# Patient Record
Sex: Male | Born: 1945
Health system: Southern US, Community
[De-identification: ages and names within clinical notes are randomized; demographics above are authoritative.]

## PROBLEM LIST (undated history)

## (undated) DIAGNOSIS — F1010 Alcohol abuse, uncomplicated: Secondary | ICD-10-CM

## (undated) DIAGNOSIS — Y249XXA Unspecified firearm discharge, undetermined intent, initial encounter: Secondary | ICD-10-CM

## (undated) DIAGNOSIS — I10 Essential (primary) hypertension: Secondary | ICD-10-CM

## (undated) DIAGNOSIS — H409 Unspecified glaucoma: Secondary | ICD-10-CM

## (undated) DIAGNOSIS — M5090 Cervical disc disorder, unspecified, unspecified cervical region: Secondary | ICD-10-CM

## (undated) DIAGNOSIS — E785 Hyperlipidemia, unspecified: Secondary | ICD-10-CM

## (undated) DIAGNOSIS — M1990 Unspecified osteoarthritis, unspecified site: Secondary | ICD-10-CM

## (undated) DIAGNOSIS — K219 Gastro-esophageal reflux disease without esophagitis: Secondary | ICD-10-CM

## (undated) DIAGNOSIS — C61 Malignant neoplasm of prostate: Secondary | ICD-10-CM

## (undated) DIAGNOSIS — N529 Male erectile dysfunction, unspecified: Secondary | ICD-10-CM

## (undated) DIAGNOSIS — W3400XA Accidental discharge from unspecified firearms or gun, initial encounter: Secondary | ICD-10-CM

## (undated) HISTORY — PX: CATARACT EXTRACTION: SUR2

## (undated) HISTORY — DX: Gastro-esophageal reflux disease without esophagitis: K21.9

## (undated) HISTORY — PX: KNEE SURGERY: SHX244

## (undated) HISTORY — DX: Alcohol abuse, uncomplicated: F10.10

## (undated) HISTORY — DX: Hyperlipidemia, unspecified: E78.5

## (undated) HISTORY — DX: Unspecified glaucoma: H40.9

## (undated) HISTORY — DX: Malignant neoplasm of prostate: C61

## (undated) HISTORY — DX: Accidental discharge from unspecified firearms or gun, initial encounter: W34.00XA

## (undated) HISTORY — DX: Essential (primary) hypertension: I10

## (undated) HISTORY — DX: Other disorders of calcium metabolism: E83.59

## (undated) HISTORY — DX: Cervical disc disorder, unspecified, unspecified cervical region: M50.90

## (undated) HISTORY — DX: Male erectile dysfunction, unspecified: N52.9

## (undated) HISTORY — PX: COLONOSCOPY: SHX174

## (undated) HISTORY — DX: Unspecified firearm discharge, undetermined intent, initial encounter: Y24.9XXA

---

## 1999-03-06 ENCOUNTER — Emergency Department (HOSPITAL_COMMUNITY): Admission: EM | Admit: 1999-03-06 | Discharge: 1999-03-06 | Payer: Self-pay | Admitting: Emergency Medicine

## 1999-05-07 ENCOUNTER — Emergency Department (HOSPITAL_COMMUNITY): Admission: EM | Admit: 1999-05-07 | Discharge: 1999-05-07 | Payer: Self-pay

## 2001-10-06 ENCOUNTER — Emergency Department (HOSPITAL_COMMUNITY): Admission: EM | Admit: 2001-10-06 | Discharge: 2001-10-06 | Payer: Self-pay | Admitting: Emergency Medicine

## 2001-10-06 ENCOUNTER — Encounter: Payer: Self-pay | Admitting: Emergency Medicine

## 2003-04-30 ENCOUNTER — Encounter: Payer: Self-pay | Admitting: Emergency Medicine

## 2003-04-30 ENCOUNTER — Emergency Department (HOSPITAL_COMMUNITY): Admission: EM | Admit: 2003-04-30 | Discharge: 2003-04-30 | Payer: Self-pay | Admitting: *Deleted

## 2004-03-18 ENCOUNTER — Emergency Department (HOSPITAL_COMMUNITY): Admission: AD | Admit: 2004-03-18 | Discharge: 2004-03-18 | Payer: Self-pay | Admitting: Family Medicine

## 2004-06-21 ENCOUNTER — Emergency Department (HOSPITAL_COMMUNITY): Admission: EM | Admit: 2004-06-21 | Discharge: 2004-06-21 | Payer: Self-pay | Admitting: Family Medicine

## 2004-11-05 ENCOUNTER — Emergency Department (HOSPITAL_COMMUNITY): Admission: EM | Admit: 2004-11-05 | Discharge: 2004-11-05 | Payer: Self-pay | Admitting: Emergency Medicine

## 2005-05-09 ENCOUNTER — Emergency Department (HOSPITAL_COMMUNITY): Admission: EM | Admit: 2005-05-09 | Discharge: 2005-05-09 | Payer: Self-pay | Admitting: Emergency Medicine

## 2005-05-12 ENCOUNTER — Emergency Department (HOSPITAL_COMMUNITY): Admission: EM | Admit: 2005-05-12 | Discharge: 2005-05-12 | Payer: Self-pay | Admitting: Family Medicine

## 2006-03-19 ENCOUNTER — Ambulatory Visit (HOSPITAL_COMMUNITY): Admission: RE | Admit: 2006-03-19 | Discharge: 2006-03-19 | Payer: Self-pay | Admitting: Family Medicine

## 2006-03-19 ENCOUNTER — Emergency Department (HOSPITAL_COMMUNITY): Admission: EM | Admit: 2006-03-19 | Discharge: 2006-03-19 | Payer: Self-pay | Admitting: Family Medicine

## 2007-02-28 ENCOUNTER — Emergency Department (HOSPITAL_COMMUNITY): Admission: EM | Admit: 2007-02-28 | Discharge: 2007-02-28 | Payer: Self-pay | Admitting: Emergency Medicine

## 2010-01-29 ENCOUNTER — Ambulatory Visit: Payer: Self-pay | Admitting: Internal Medicine

## 2010-01-29 DIAGNOSIS — E785 Hyperlipidemia, unspecified: Secondary | ICD-10-CM

## 2010-01-29 DIAGNOSIS — M549 Dorsalgia, unspecified: Secondary | ICD-10-CM | POA: Insufficient documentation

## 2010-01-29 DIAGNOSIS — R5381 Other malaise: Secondary | ICD-10-CM

## 2010-01-29 DIAGNOSIS — R21 Rash and other nonspecific skin eruption: Secondary | ICD-10-CM

## 2010-01-29 DIAGNOSIS — I10 Essential (primary) hypertension: Secondary | ICD-10-CM

## 2010-01-29 DIAGNOSIS — G43009 Migraine without aura, not intractable, without status migrainosus: Secondary | ICD-10-CM | POA: Insufficient documentation

## 2010-01-29 DIAGNOSIS — R5383 Other fatigue: Secondary | ICD-10-CM

## 2010-01-29 DIAGNOSIS — M5090 Cervical disc disorder, unspecified, unspecified cervical region: Secondary | ICD-10-CM

## 2010-01-29 HISTORY — DX: Other disorders of calcium metabolism: E83.59

## 2010-01-29 HISTORY — DX: Hyperlipidemia, unspecified: E78.5

## 2010-01-29 HISTORY — DX: Cervical disc disorder, unspecified, unspecified cervical region: M50.90

## 2010-01-30 ENCOUNTER — Encounter (INDEPENDENT_AMBULATORY_CARE_PROVIDER_SITE_OTHER): Payer: Self-pay | Admitting: *Deleted

## 2010-02-01 ENCOUNTER — Encounter (INDEPENDENT_AMBULATORY_CARE_PROVIDER_SITE_OTHER): Payer: Self-pay | Admitting: *Deleted

## 2010-02-04 ENCOUNTER — Ambulatory Visit: Payer: Self-pay | Admitting: Gastroenterology

## 2010-02-15 ENCOUNTER — Ambulatory Visit: Payer: Self-pay | Admitting: Gastroenterology

## 2010-02-20 ENCOUNTER — Encounter: Payer: Self-pay | Admitting: Gastroenterology

## 2010-10-26 ENCOUNTER — Emergency Department (HOSPITAL_COMMUNITY): Admission: EM | Admit: 2010-10-26 | Discharge: 2010-10-26 | Payer: Self-pay | Admitting: Family Medicine

## 2010-10-31 ENCOUNTER — Ambulatory Visit: Payer: Self-pay | Admitting: Internal Medicine

## 2010-10-31 DIAGNOSIS — R0789 Other chest pain: Secondary | ICD-10-CM

## 2010-10-31 DIAGNOSIS — K219 Gastro-esophageal reflux disease without esophagitis: Secondary | ICD-10-CM

## 2011-01-26 LAB — CONVERTED CEMR LAB
ALT: 17 units/L (ref 0–53)
BUN: 12 mg/dL (ref 6–23)
Basophils Relative: 0.1 % (ref 0.0–3.0)
CO2: 30 meq/L (ref 19–32)
Chloride: 106 meq/L (ref 96–112)
Cholesterol: 195 mg/dL (ref 0–200)
Eosinophils Relative: 2.6 % (ref 0.0–5.0)
HCT: 42.1 % (ref 39.0–52.0)
LDL Cholesterol: 117 mg/dL — ABNORMAL HIGH (ref 0–99)
Lymphs Abs: 2 10*3/uL (ref 0.7–4.0)
MCV: 97.7 fL (ref 78.0–100.0)
Monocytes Absolute: 0.4 10*3/uL (ref 0.1–1.0)
Platelets: 353 10*3/uL (ref 150.0–400.0)
Potassium: 3.7 meq/L (ref 3.5–5.1)
Sed Rate: 22 mm/hr (ref 0–22)
Specific Gravity, Urine: 1.015 (ref 1.000–1.030)
TSH: 1.52 microintl units/mL (ref 0.35–5.50)
Total Bilirubin: 0.4 mg/dL (ref 0.3–1.2)
Total Protein, Urine: NEGATIVE mg/dL
Total Protein: 7.6 g/dL (ref 6.0–8.3)
Triglycerides: 130 mg/dL (ref 0.0–149.0)
Urine Glucose: NEGATIVE mg/dL
VLDL: 26 mg/dL (ref 0.0–40.0)
WBC: 7 10*3/uL (ref 4.5–10.5)

## 2011-01-30 NOTE — Letter (Signed)
Summary: [facility] Instructions  [facility]  [address] [place], [place] [address]   Phone: [phone]  Fax: [phone]       Brian Allen    [birth year]    MRN: [id number]        Procedure Day /Date: 02/15/10  Friday     Arrival Time: 7:30am     Procedure Time: 8:00am     Location of Procedure:                    _ x_  [facility] (4th Floor)   PREPARATION FOR COLONOSCOPY WITH MOVIPREP   Starting 5 days prior to your procedure _ 2/13/11_ do not eat nuts, seeds, popcorn, corn, beans, peas,  salads, or any raw vegetables.  Do not take any fiber supplements (e.g. Metamucil, Citrucel, and Benefiber).  THE DAY BEFORE YOUR PROCEDURE         DATE:  02/14/10  DAY: Thursday  1.  Drink clear liquids the entire day-NO SOLID FOOD  2.  Do not drink anything colored red or purple.  Avoid juices with pulp.  No orange juice.  3.  Drink at least 64 oz. (8 glasses) of fluid/clear liquids during the day to prevent dehydration and help the prep work efficiently.  CLEAR LIQUIDS INCLUDE: Water Jello Ice Popsicles Tea (sugar ok, no milk/cream) Powdered fruit flavored drinks Coffee (sugar ok, no milk/cream) Gatorade Juice: apple, white grape, white cranberry  Lemonade Clear bullion, consomm, broth Carbonated beverages (any kind) Strained chicken noodle soup Hard Candy                             4.  In the morning, mix first dose of MoviPrep solution:    Empty 1 Pouch A and 1 Pouch B into the disposable container    Add lukewarm drinking water to the top line of the container. Mix to dissolve    Refrigerate (mixed solution should be used within 24 hrs)  5.  Begin drinking the prep at 5:00 p.m. The MoviPrep container is divided by 4 marks.   Every 15 minutes drink the solution down to the next mark (approximately 8 oz) until the full liter is complete.   6.  Follow completed prep with 16 oz of clear liquid of your choice (Nothing red or purple).   Continue to drink clear liquids until bedtime.  7.  Before going to bed, mix second dose of MoviPrep solution:    Empty 1 Pouch A and 1 Pouch B into the disposable container    Add lukewarm drinking water to the top line of the container. Mix to dissolve    Refrigerate  THE DAY OF YOUR PROCEDURE      DATE: 02/15/10 DAY:  Friday  Beginning at  3:00a.m. (5 hours before procedure):         1. Every 15 minutes, drink the solution down to the next mark (approx 8 oz) until the full liter is complete.  2. Follow completed prep with 16 oz. of clear liquid of your choice.    3. You may drink clear liquids until  6:00am (2 HOURS BEFORE PROCEDURE).   MEDICATION INSTRUCTIONS  Unless otherwise instructed, you should take regular prescription medications with a small sip of water   as early as possible the morning of your procedure.     Additional medication instructions: _         OTHER  INSTRUCTIONS  You will need a responsible adult at least 67 years of age to accompany you and drive you home.   This person must remain in the waiting room during your procedure.  Wear loose fitting clothing that is easily removed.  Leave jewelry and other valuables at home.  However, you may wish to bring a book to read or  an iPod/MP3 player to listen to music as you wait for your procedure to start.  Remove all body piercing jewelry and leave at home.  Total time from sign-in until discharge is approximately 2-3 hours.  You should go home directly after your procedure and rest.  You can resume normal activities the  day after your procedure.  The day of your procedure you should not:   Drive   Make legal decisions   Operate machinery   Drink alcohol   Return to work  You will receive specific instructions about eating, activities and medications before you leave.    The above instructions have been reviewed and explained to me by   Sherren Kerns RN  February 04, 2010 10:59  AM    I fully understand and can verbalize these instructions _____________________________ Date _________
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
## 2011-01-30 NOTE — Letter (Signed)
Summary: Patient Notice- Polyp Results  [facility]  [address] [place], [place] [address]   Phone: [phone]  Fax: [phone]        February 20, 2010 MRN: [id number]    Brian Allen [address] [place], [place]  [address]    Dear [name],  I am pleased to inform you that the colon polyp(s) removed during your recent colonoscopy was (were) found to be benign (no cancer detected) upon pathologic examination.  I recommend you have a repeat colonoscopy examination in 5_ years to look for recurrent polyps, as having colon polyps increases your risk for having recurrent polyps or even colon cancer in the future.  Should you develop new or worsening symptoms of abdominal pain, bowel habit changes or bleeding from the rectum or bowels, please schedule an evaluation with either your primary care physician or with me.  Additional information/recommendations:  __ No further action with gastroenterology is needed at this time. Please      follow-up with your primary care physician for your other healthcare      needs.  __ Please call [phone] to schedule a return visit to review your      situation.  __ Please keep your follow-up visit as already scheduled.  _x_ Continue treatment plan as outlined the day of your exam.  Please call us if you are having persistent problems or have questions about your condition that have not been fully answered at this time.  Sincerely,  Louis Meckel MD  This letter has been electronically signed by your physician.  Appended Document: Patient Notice- Polyp Results  Letter mailed 2.24.11

## 2011-01-30 NOTE — Procedures (Signed)
Summary: Colonoscopy  Patient: Brian Allen Note: All result statuses are Final unless otherwise noted.  Tests: (1) Colonoscopy (COL)   COL Colonoscopy           DONE     [facility]     [address]     [place], [place]  [address]           COLONOSCOPY PROCEDURE REPORT           PATIENT:  Brian Allen, Brian Allen  MR#:  [id number]     BIRTHDATE:  27-Dec-1946, 63 yrs. old  GENDER:  male           ENDOSCOPIST:  Barbette Hair. Arlyce Dice, MD     Referred by:  Oliver Barre, M.D.           PROCEDURE DATE:  02/15/2010     PROCEDURE:  Colonoscopy with snare polypectomy, Colon with cold     biopsy polypectomy     ASA CLASS:  Class II     INDICATIONS:  Routine Risk Screening           MEDICATIONS:   Fentanyl 100 mcg IV, Versed 10 mg IV, Benadryl 25     mg IV           DESCRIPTION OF PROCEDURE:   After the risks benefits and     alternatives of the procedure were thoroughly explained, informed     consent was obtained.  Digital rectal exam was performed and     revealed no abnormalities.   The LB [id number] endoscope     was introduced through the anus and advanced to the cecum, which     was identified by both the appendix and ileocecal valve, without     limitations.  The quality of the prep was good, using MoviPrep.     The instrument was then slowly withdrawn as the colon was fully     examined.     <<PROCEDUREIMAGES>>           FINDINGS:  A sessile polyp was found in the ascending colon. It     was 2 mm in size. The polyp was removed using cold biopsy forceps     (see image5).  A sessile polyp was found in the sigmoid colon. It     was 2 mm in size. It was found 15 cm from the point of entry.     Polyp was snared without cautery. Retrieval was successful (see     image9). snare polyp  Mild diverticulosis was found in the sigmoid     colon (see image1 and image8).  This was otherwise a normal     examination of the colon (see image3, image4, image6, image10, and     image11).    Retroflexed views in the rectum revealed no     abnormalities.    The scope was then withdrawn from the patient     and the procedure completed.           COMPLICATIONS:  None           ENDOSCOPIC IMPRESSION:     1) 2 mm sessile polyp in the ascending colon     2) 2 mm sessile polyp in the sigmoid colon     3) Mild diverticulosis in the sigmoid colon     4) Otherwise normal examination     RECOMMENDATIONS:     1) If the polyp(s) removed today are proven to be adenomatous     (  pre-cancerous) polyps, you will need a repeat colonoscopy in 5     years. Otherwise you should continue to follow colorectal cancer     screening guidelines for "routine risk" patients with colonoscopy     in 10 years.           REPEAT EXAM:   You will receive a letter from Dr. Arlyce Dice in 1-2     weeks, after reviewing the final pathology, with followup     recommendations.           ______________________________     Barbette Hair Arlyce Dice, MD           CC:           n.     eSIGNED:   Barbette Hair. Memori Sammon at 02/15/2010 08:46 AM           Alphonse Guild, [id number]  Note: An exclamation mark (!) indicates a result that was not dispersed into the flowsheet. Document Creation Date: 02/15/2010 8:47 AM _______________________________________________________________________  (1) Order result status: Final Collection or observation date-time: 02/15/2010 08:37 Requested date-time:  Receipt date-time:  Reported date-time:  Referring Physician:   Ordering Physician: Melvia Heaps [phone]) Specimen Source:  Source: Launa Grill Order Number: [id number] Lab site:   Appended Document: Colonoscopy     Procedures Next Due Date:    Colonoscopy: 02/2015

## 2011-01-30 NOTE — Letter (Signed)
Summary: Previsit letter  [facility]  [address] [place], [place] [address]   Phone: [phone]  Fax: [phone]       01/30/2010 MRN: [id number]  Brian Allen [address] [place], [place]  [address]  Dear [name],  Welcome to the Gastroenterology Division at [facility].    You are scheduled to see a nurse for your pre-procedure visit on 02-04-10 at 10:30a.m. on the 3rd floor at [facility], [address]  We ask that you try to arrive at our office 15 minutes prior to your appointment time to allow for check-in.  Your nurse visit will consist of discussing your medical and surgical history, your immediate family medical history, and your medications.    Please bring a complete list of all your medications or, if you prefer, bring the medication bottles and we will list them.  We will need to be aware of both prescribed and over the counter drugs.  We will need to know exact dosage information as well.  If you are on blood thinners (Coumadin, Plavix, Aggrenox, Ticlid, etc.) please call our office today/prior to your appointment, as we need to consult with your physician about holding your medication.   Please be prepared to read and sign documents such as consent forms, a financial agreement, and acknowledgement forms.  If necessary, and with your consent, a friend or relative is welcome to sit-in on the nurse visit with you.  Please bring your insurance card so that we may make a copy of it.  If your insurance requires a referral to see a specialist, please bring your referral form from your primary care physician.  No co-pay is required for this nurse visit.     If you cannot keep your appointment, please call [phone] to cancel or reschedule prior to your appointment date.  This allows [place] the opportunity to schedule an appointment for another patient in need of care.    Thank you for choosing [facility] for your medical needs.   We appreciate the opportunity to care for you.  Please visit [place] at our website  to learn more about our practice.                     Sincerely.                                                                                                                   The Gastroenterology Division
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
## 2011-01-30 NOTE — Miscellaneous (Signed)
Summary: previsit lec/rhm  Clinical Lists Changes  Medications: Added new medication of MOVIPREP 100 GM  SOLR (PEG-KCL-NACL-NASULF-NA ASC-C) As per prep instructions. - Signed Rx of MOVIPREP 100 GM  SOLR (PEG-KCL-NACL-NASULF-NA ASC-C) As per prep instructions.;  #1 x 0;  Signed;  Entered by: Sherren Kerns RN;  Authorized by: Louis Meckel MD;  Method used: Electronically to [facility] [address] [place], [place], [place]  [address], Ph: [phone], Fax: [phone] Observations: Added new observation of ALLERGY REV: Done (02/04/2010 9:47)    Prescriptions: MOVIPREP 100 GM  SOLR (PEG-KCL-NACL-NASULF-NA ASC-C) As per prep instructions.  #1 x 0   Entered by:   Sherren Kerns RN   Authorized by:   Louis Meckel MD   Signed by:   Sherren Kerns RN on 02/04/2010   Method used:   Electronically to        [facility] (retail)       [address]       [place], [place]  [address]       Ph: [phone]       Fax: [phone]   RxID:   [id number]

## 2011-02-05 ENCOUNTER — Ambulatory Visit (INDEPENDENT_AMBULATORY_CARE_PROVIDER_SITE_OTHER): Payer: Medicare Other | Admitting: Internal Medicine

## 2011-02-05 ENCOUNTER — Encounter: Payer: Self-pay | Admitting: Internal Medicine

## 2011-02-05 ENCOUNTER — Encounter (INDEPENDENT_AMBULATORY_CARE_PROVIDER_SITE_OTHER): Payer: Self-pay | Admitting: *Deleted

## 2011-02-05 ENCOUNTER — Other Ambulatory Visit: Payer: Medicare Other

## 2011-02-05 ENCOUNTER — Other Ambulatory Visit: Payer: Self-pay | Admitting: Internal Medicine

## 2011-02-05 DIAGNOSIS — I10 Essential (primary) hypertension: Secondary | ICD-10-CM

## 2011-02-05 DIAGNOSIS — R972 Elevated prostate specific antigen [PSA]: Secondary | ICD-10-CM | POA: Insufficient documentation

## 2011-02-05 DIAGNOSIS — K219 Gastro-esophageal reflux disease without esophagitis: Secondary | ICD-10-CM

## 2011-02-05 DIAGNOSIS — R5383 Other fatigue: Secondary | ICD-10-CM

## 2011-02-05 DIAGNOSIS — E785 Hyperlipidemia, unspecified: Secondary | ICD-10-CM

## 2011-02-05 DIAGNOSIS — Z23 Encounter for immunization: Secondary | ICD-10-CM

## 2011-02-05 DIAGNOSIS — Z1289 Encounter for screening for malignant neoplasm of other sites: Secondary | ICD-10-CM

## 2011-02-05 DIAGNOSIS — R5381 Other malaise: Secondary | ICD-10-CM

## 2011-02-05 DIAGNOSIS — Z0000 Encounter for general adult medical examination without abnormal findings: Secondary | ICD-10-CM

## 2011-02-05 LAB — LIPID PANEL
Cholesterol: 191 mg/dL (ref 0–200)
LDL Cholesterol: 117 mg/dL — ABNORMAL HIGH (ref 0–99)
Triglycerides: 122 mg/dL (ref 0.0–149.0)

## 2011-02-05 LAB — CBC WITH DIFFERENTIAL/PLATELET
Basophils Relative: 0.7 % (ref 0.0–3.0)
Eosinophils Absolute: 0.2 10*3/uL (ref 0.0–0.7)
HCT: 41 % (ref 39.0–52.0)
Lymphs Abs: 1.9 10*3/uL (ref 0.7–4.0)
MCHC: 34.3 g/dL (ref 30.0–36.0)
MCV: 95.2 fl (ref 78.0–100.0)
Monocytes Absolute: 0.6 10*3/uL (ref 0.1–1.0)
Neutrophils Relative %: 66.3 % (ref 43.0–77.0)
RBC: 4.31 Mil/uL (ref 4.22–5.81)

## 2011-02-05 LAB — URINALYSIS
Ketones, ur: NEGATIVE
Specific Gravity, Urine: 1.015 (ref 1.000–1.030)
Total Protein, Urine: NEGATIVE
Urine Glucose: NEGATIVE
pH: 6 (ref 5.0–8.0)

## 2011-02-05 LAB — HEPATIC FUNCTION PANEL
Albumin: 4 g/dL (ref 3.5–5.2)
Bilirubin, Direct: 0.1 mg/dL (ref 0.0–0.3)
Total Protein: 7.1 g/dL (ref 6.0–8.3)

## 2011-02-05 LAB — PSA: PSA: 6.49 ng/mL — ABNORMAL HIGH (ref 0.10–4.00)

## 2011-02-05 LAB — BASIC METABOLIC PANEL
BUN: 12 mg/dL (ref 6–23)
CO2: 27 mEq/L (ref 19–32)
Chloride: 103 mEq/L (ref 96–112)
Creatinine, Ser: 0.8 mg/dL (ref 0.4–1.5)

## 2011-02-05 LAB — TSH: TSH: 1.12 u[IU]/mL (ref 0.35–5.50)

## 2011-02-11 ENCOUNTER — Encounter (INDEPENDENT_AMBULATORY_CARE_PROVIDER_SITE_OTHER): Payer: Self-pay | Admitting: *Deleted

## 2011-02-13 NOTE — Miscellaneous (Signed)
Summary: Orders Update  Clinical Lists Changes  Problems: Added new problem of PSA, INCREASED (ICD-790.93) Orders: Added new Referral order of Urology Referral (Urology) - Signed
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
## 2011-02-19 NOTE — Letter (Signed)
Summary: [facility] Consult Scheduled Letter  [facility]  [address] [place], [place] [address]   Phone: [phone]  Fax: [phone]      02/11/2011 MRN: [id number]  Brian Allen [address] [place], [place]  [address]  [place]    Dear [name],      We have scheduled an appointment for you.  At the recommendation of Dr.John, we have scheduled you a consult with Dr Isabel Caprice on 02/24/11 at 2:00pm.  Their phone number is [phone].  If this appointment day and time is not convenient for you, please feel free to call the office of the doctor you are being referred to at the number listed above and reschedule the appointment.    [facility] [address] [place], [place] [address]    Thank you,  Patient Care Coordinator [facility]

## 2011-02-24 ENCOUNTER — Encounter: Payer: Self-pay | Admitting: Internal Medicine

## 2011-03-06 ENCOUNTER — Encounter: Payer: Self-pay | Admitting: Internal Medicine

## 2011-03-06 NOTE — Consult Note (Signed)
Summary: [facility]  [facility]   Imported By: Sherian Rein 02/27/2011 07:18:07  _____________________________________________________________________  External Attachment:    Type:   Image     Comment:   External Document

## 2011-03-18 NOTE — Progress Notes (Signed)
Summary: [facility]  [facility]   Imported By: Sherian Rein 03/12/2011 10:24:58  _____________________________________________________________________  External Attachment:    Type:   Image     Comment:   External Document

## 2011-04-10 ENCOUNTER — Ambulatory Visit: Payer: Medicare Other | Admitting: Radiation Oncology

## 2011-04-15 ENCOUNTER — Ambulatory Visit: Payer: Medicare Other | Attending: Radiation Oncology | Admitting: Radiation Oncology

## 2011-04-15 DIAGNOSIS — C61 Malignant neoplasm of prostate: Secondary | ICD-10-CM | POA: Insufficient documentation

## 2011-04-21 ENCOUNTER — Other Ambulatory Visit: Payer: Self-pay | Admitting: Urology

## 2011-04-21 ENCOUNTER — Ambulatory Visit
Admission: RE | Admit: 2011-04-21 | Discharge: 2011-04-21 | Disposition: A | Discharge status: Home / Self Care | Payer: Medicaid Other | Source: Ambulatory Visit | Attending: Urology | Admitting: Urology

## 2011-04-21 ENCOUNTER — Encounter (HOSPITAL_BASED_OUTPATIENT_CLINIC_OR_DEPARTMENT_OTHER): Payer: Medicare Other

## 2011-04-21 DIAGNOSIS — Z01811 Encounter for preprocedural respiratory examination: Secondary | ICD-10-CM

## 2011-05-27 LAB — COMPREHENSIVE METABOLIC PANEL
ALT: 15 U/L (ref 0–53)
AST: 18 U/L (ref 0–37)
Albumin: 3.7 g/dL (ref 3.5–5.2)
CO2: 28 mEq/L (ref 19–32)
Calcium: 9.4 mg/dL (ref 8.4–10.5)
Chloride: 106 mEq/L (ref 96–112)
GFR calc Af Amer: >60 mL/min (ref >60)
GFR calc non Af Amer: >60 mL/min (ref >60)
Sodium: 142 mEq/L (ref 135–145)
Total Bilirubin: 0.2 mg/dL — ABNORMAL LOW (ref 0.3–1.2)

## 2011-05-27 LAB — CBC
HCT: 38.5 % — ABNORMAL LOW (ref 39.0–52.0)
Hemoglobin: 13.2 g/dL (ref 13.0–17.0)
MCH: 31.2 pg (ref 26.0–34.0)
MCV: 91 fL (ref 78.0–100.0)
RBC: 4.23 MIL/uL (ref 4.22–5.81)
WBC: 9.3 10*3/uL (ref 4.0–10.5)

## 2011-05-27 LAB — APTT: aPTT: 35 seconds (ref 24–37)

## 2011-06-02 ENCOUNTER — Ambulatory Visit (HOSPITAL_BASED_OUTPATIENT_CLINIC_OR_DEPARTMENT_OTHER)
Admission: RE | Admit: 2011-06-02 | Discharge: 2011-06-02 | Disposition: A | Discharge status: Home / Self Care | Payer: Medicaid Other | Source: Ambulatory Visit | Attending: Urology | Admitting: Urology

## 2011-06-02 DIAGNOSIS — R9431 Abnormal electrocardiogram [ECG] [EKG]: Secondary | ICD-10-CM | POA: Insufficient documentation

## 2011-06-02 DIAGNOSIS — Z01812 Encounter for preprocedural laboratory examination: Secondary | ICD-10-CM | POA: Insufficient documentation

## 2011-06-02 DIAGNOSIS — Z538 Procedure and treatment not carried out for other reasons: Secondary | ICD-10-CM | POA: Insufficient documentation

## 2011-06-02 DIAGNOSIS — C61 Malignant neoplasm of prostate: Secondary | ICD-10-CM | POA: Insufficient documentation

## 2011-06-06 ENCOUNTER — Ambulatory Visit (HOSPITAL_BASED_OUTPATIENT_CLINIC_OR_DEPARTMENT_OTHER)
Admission: RE | Admit: 2011-06-06 | Discharge: 2011-06-06 | Disposition: A | Discharge status: Home / Self Care | Payer: Medicare Other | Source: Ambulatory Visit | Attending: Urology | Admitting: Urology

## 2011-06-06 ENCOUNTER — Ambulatory Visit (HOSPITAL_COMMUNITY): Payer: Medicare Other | Attending: Urology

## 2011-06-06 DIAGNOSIS — C61 Malignant neoplasm of prostate: Secondary | ICD-10-CM | POA: Insufficient documentation

## 2011-06-11 NOTE — Op Note (Signed)
  NAMEJABBAR, PALMERO NO.:  [id number]  MEDICAL RECORD NO.:  [id number]  LOCATION:                                 FACILITY:  PHYSICIAN:  Valetta Fuller, M.D.  DATE OF BIRTH:  02-Apr-1946  DATE OF PROCEDURE:  06/06/2011 DATE OF DISCHARGE:                              OPERATIVE REPORT   PREOPERATIVE DIAGNOSIS:  Adenocarcinoma of the prostate.  POSTOPERATIVE DIAGNOSIS:  Adenocarcinoma of the prostate.  PROCEDURE PERFORMED: 1. Radioactive I-125 prostatic seed implantation via Careers adviser. 2. Flexible cystoscopy.  SURGEONS: 1. Valetta Fuller, M.D. 2. Maryln Gottron, M.D.  ANESTHESIA:  General.  INDICATIONS:  Mr. Callow was diagnosed with adenocarcinoma of the prostate.  Pretreatment PSA was 6.5 and digital rectal exam was unremarkable.  Ultrasound revealed approximately a 40 g prostate.  The patient had 3 out of 12 biopsies positive.  These all revealed a Gleason 3 +4 = 7 prostate cancer involving 5 to 20% of each core.  The patient was felt to have favorable clinical stage T1c disease.  The patient underwent extensive consultation with regard to treatment options.  He was also sent to Dr. Chipper Herb for radiation oncology consultation and the patient chose I-125 seed implantation.  He appeared to understand the advantages, disadvantages, potential complications of the surgery.  Of note the patient was originally scheduled earlier this week but did not do his Fleet's enema and bowel prep and was noted to have a large amount of stool in his rectal vault.  For that reason, surgery was postponed until today.  He has now done the appropriate prep.  The patient received perioperative ciprofloxacin and placement of PAS compression boots.  TECHNIQUE AND FINDINGS:  The patient was brought to the operating room where he had successful induction of general anesthesia.  He was placed in mid lithotomy position.  Foley catheter was inserted  with contrast in the balloon.  Radiation oncology services were there initially. Transrectal ultrasound probe was placed and anchored to a floor stand. Anchoring needles were placed in the prostate.  Real time ultrasound images were taken and used to contour the urethra, prostate and rectum. A dosing plan was then established and we were contacted to come in the operating room.  Appropriate surgical time-out was again performed.  A total of 27 needles were placed all with realtime transrectal ultrasound sagittal imaging.  After needle placement, the Nucletron robotic implanter was used to deliver the seeds.  A total of 78 active seeds were placed with a reference dose of 145 gray.  At the completion of the procedure, fluoroscopic imaging showed what appeared to be excellent distribution of the seeds.  The patient's Foley catheter was removed and flexible cystoscopy was performed.  No seeds were noted in the prostatic urethra and bladder and a new Foley catheter was placed.  The patient was brought to recovery room in stable condition. Valetta Fuller, M.D.     DSG/MEDQ  D:  06/09/2011  T:  06/09/2011  Job:  [id number]  Electronically Signed by Barron Alvine M.D. on 06/11/2011 08:42:03 AM

## 2011-06-30 ENCOUNTER — Ambulatory Visit: Payer: Medicaid Other | Admitting: Radiation Oncology

## 2011-09-14 ENCOUNTER — Emergency Department (HOSPITAL_COMMUNITY)
Admission: EM | Admit: 2011-09-14 | Discharge: 2011-09-14 | Disposition: A | Discharge status: Home / Self Care | Payer: Medicare Other | Attending: Emergency Medicine | Admitting: Emergency Medicine

## 2011-09-14 ENCOUNTER — Emergency Department (HOSPITAL_COMMUNITY): Payer: Medicare Other

## 2011-09-14 DIAGNOSIS — I10 Essential (primary) hypertension: Secondary | ICD-10-CM | POA: Insufficient documentation

## 2011-09-14 DIAGNOSIS — R109 Unspecified abdominal pain: Secondary | ICD-10-CM | POA: Insufficient documentation

## 2011-09-14 DIAGNOSIS — R10819 Abdominal tenderness, unspecified site: Secondary | ICD-10-CM | POA: Insufficient documentation

## 2011-09-14 DIAGNOSIS — R112 Nausea with vomiting, unspecified: Secondary | ICD-10-CM | POA: Insufficient documentation

## 2011-09-14 DIAGNOSIS — R300 Dysuria: Secondary | ICD-10-CM | POA: Insufficient documentation

## 2011-09-14 DIAGNOSIS — E7800 Pure hypercholesterolemia, unspecified: Secondary | ICD-10-CM | POA: Insufficient documentation

## 2011-09-14 DIAGNOSIS — Z8546 Personal history of malignant neoplasm of prostate: Secondary | ICD-10-CM | POA: Insufficient documentation

## 2011-09-14 LAB — COMPREHENSIVE METABOLIC PANEL
Albumin: 3.5 g/dL (ref 3.5–5.2)
Alkaline Phosphatase: 73 U/L (ref 39–117)
BUN: 11 mg/dL (ref 6–23)
Calcium: 9.4 mg/dL (ref 8.4–10.5)
Creatinine, Ser: 0.74 mg/dL (ref 0.50–1.35)
GFR calc Af Amer: >60 mL/min (ref >60)
Glucose, Bld: 95 mg/dL (ref 70–99)
Total Protein: 6.6 g/dL (ref 6.0–8.3)

## 2011-09-14 LAB — DIFFERENTIAL
Eosinophils Absolute: 0.2 10*3/uL (ref 0.0–0.7)
Eosinophils Relative: 3 % (ref 0–5)
Lymphocytes Relative: 31 % (ref 12–46)
Lymphs Abs: 1.9 10*3/uL (ref 0.7–4.0)
Monocytes Absolute: 0.6 10*3/uL (ref 0.1–1.0)

## 2011-09-14 LAB — URINALYSIS, ROUTINE W REFLEX MICROSCOPIC
Bilirubin Urine: NEGATIVE
Hgb urine dipstick: NEGATIVE
Ketones, ur: NEGATIVE mg/dL
Nitrite: NEGATIVE
Protein, ur: NEGATIVE mg/dL
Specific Gravity, Urine: 1.013 (ref 1.005–1.030)
Urobilinogen, UA: 1 mg/dL (ref 0.0–1.0)

## 2011-09-14 LAB — CBC
HCT: 37 % — ABNORMAL LOW (ref 39.0–52.0)
MCHC: 34.6 g/dL (ref 30.0–36.0)
MCV: 91.6 fL (ref 78.0–100.0)
Platelets: 311 10*3/uL (ref 150–400)
RDW: 14 % (ref 11.5–15.5)
WBC: 6.3 10*3/uL (ref 4.0–10.5)

## 2011-09-14 MED ORDER — IOHEXOL 300 MG/ML  SOLN
100.0000 mL | Freq: Once | INTRAMUSCULAR | Status: AC | PRN
  Administered 2011-09-14: 100 mL via INTRAVENOUS

## 2011-09-15 LAB — URINE CULTURE
Colony Count: NO GROWTH
Culture  Setup Time: 201209162234
Culture: NO GROWTH

## 2011-09-16 ENCOUNTER — Ambulatory Visit: Payer: Medicare Other | Admitting: Internal Medicine

## 2012-01-20 DIAGNOSIS — C61 Malignant neoplasm of prostate: Secondary | ICD-10-CM | POA: Diagnosis not present

## 2012-01-27 DIAGNOSIS — C61 Malignant neoplasm of prostate: Secondary | ICD-10-CM | POA: Diagnosis not present

## 2012-03-24 ENCOUNTER — Other Ambulatory Visit: Payer: Self-pay | Admitting: Internal Medicine

## 2012-03-24 ENCOUNTER — Encounter: Payer: Self-pay | Admitting: Internal Medicine

## 2012-03-24 ENCOUNTER — Other Ambulatory Visit (INDEPENDENT_AMBULATORY_CARE_PROVIDER_SITE_OTHER): Payer: Medicare Other

## 2012-03-24 ENCOUNTER — Ambulatory Visit (INDEPENDENT_AMBULATORY_CARE_PROVIDER_SITE_OTHER): Payer: Medicare Other | Admitting: Internal Medicine

## 2012-03-24 VITALS — BP 142/92 | HR 86 | Temp 97.6°F | Ht 65.0 in | Wt 171.5 lb

## 2012-03-24 DIAGNOSIS — Z0000 Encounter for general adult medical examination without abnormal findings: Secondary | ICD-10-CM | POA: Insufficient documentation

## 2012-03-24 DIAGNOSIS — I10 Essential (primary) hypertension: Secondary | ICD-10-CM

## 2012-03-24 DIAGNOSIS — F1011 Alcohol abuse, in remission: Secondary | ICD-10-CM | POA: Insufficient documentation

## 2012-03-24 DIAGNOSIS — C61 Malignant neoplasm of prostate: Secondary | ICD-10-CM

## 2012-03-24 DIAGNOSIS — W3400XA Accidental discharge from unspecified firearms or gun, initial encounter: Secondary | ICD-10-CM | POA: Insufficient documentation

## 2012-03-24 DIAGNOSIS — E785 Hyperlipidemia, unspecified: Secondary | ICD-10-CM | POA: Diagnosis not present

## 2012-03-24 DIAGNOSIS — N529 Male erectile dysfunction, unspecified: Secondary | ICD-10-CM | POA: Diagnosis not present

## 2012-03-24 HISTORY — DX: Male erectile dysfunction, unspecified: N52.9

## 2012-03-24 HISTORY — DX: Malignant neoplasm of prostate: C61

## 2012-03-24 LAB — HEPATIC FUNCTION PANEL
ALT: 22 U/L (ref 0–53)
AST: 22 U/L (ref 0–37)
Albumin: 4.1 g/dL (ref 3.5–5.2)
Total Bilirubin: 0.3 mg/dL (ref 0.3–1.2)
Total Protein: 7.5 g/dL (ref 6.0–8.3)

## 2012-03-24 LAB — CBC WITH DIFFERENTIAL/PLATELET
Eosinophils Absolute: 0.2 10*3/uL (ref 0.0–0.7)
Eosinophils Relative: 2.1 % (ref 0.0–5.0)
Lymphocytes Relative: 19.4 % (ref 12.0–46.0)
MCV: 96 fl (ref 78.0–100.0)
Monocytes Absolute: 0.7 10*3/uL (ref 0.1–1.0)
Neutrophils Relative %: 70.2 % (ref 43.0–77.0)
Platelets: 312 10*3/uL (ref 150.0–400.0)
WBC: 8.4 10*3/uL (ref 4.5–10.5)

## 2012-03-24 LAB — BASIC METABOLIC PANEL
BUN: 14 mg/dL (ref 6–23)
Chloride: 106 mEq/L (ref 96–112)
Glucose, Bld: 80 mg/dL (ref 70–99)
Potassium: 3.5 mEq/L (ref 3.5–5.1)

## 2012-03-24 LAB — LDL CHOLESTEROL, DIRECT: Direct LDL: 147.6 mg/dL

## 2012-03-24 LAB — TSH: TSH: 1.24 u[IU]/mL (ref 0.35–5.50)

## 2012-03-24 MED ORDER — LOVASTATIN 20 MG PO TABS: 20.0000 mg | ORAL_TABLET | Freq: Every day | ORAL | Status: DC

## 2012-03-24 MED ORDER — HYDROCHLOROTHIAZIDE 25 MG PO TABS: 25.0000 mg | ORAL_TABLET | Freq: Every day | ORAL | Status: DC

## 2012-03-24 MED ORDER — VARDENAFIL HCL 20 MG PO TABS: 20.0000 mg | ORAL_TABLET | ORAL | Status: DC | PRN

## 2012-03-24 MED ORDER — LOVASTATIN 40 MG PO TABS: 40.0000 mg | ORAL_TABLET | Freq: Every day | ORAL | Status: DC

## 2012-03-24 MED ORDER — LISINOPRIL 10 MG PO TABS: 10.0000 mg | ORAL_TABLET | Freq: Every day | ORAL | Status: DC

## 2012-03-24 MED ORDER — ASPIRIN 81 MG PO TBEC: 81.0000 mg | DELAYED_RELEASE_TABLET | Freq: Every day | ORAL | Status: AC

## 2012-03-24 NOTE — Assessment & Plan Note (Signed)
Ok to change to levitra prn,  to f/u any worsening symptoms or concerns

## 2012-03-24 NOTE — Assessment & Plan Note (Addendum)
curretnly on statin, to cont diet, . Check lipids, lfts',  to f/u any worsening symptoms or concerns , for lipids today Lab Results  Component Value Date   LDLCALC 117* 02/05/2011

## 2012-03-24 NOTE — Patient Instructions (Signed)
Take all new medications as prescribed  - the levitra, and the blood pressure medication Continue all other medications as before - the aspirin  Please follow low cholesterol diet Please go to LAB in the Basement for the blood and/or urine tests to be done today You will be contacted by phone if any changes need to be made immediately.  Otherwise, you will receive a letter about your results with an explanation. Please return in 3 months, or sooner if needed

## 2012-03-24 NOTE — Assessment & Plan Note (Signed)
Uncontrolled, to add ACE, f/u 3 months  BP Readings from Last 3 Encounters:  03/24/12 142/92  02/05/11 130/92  10/31/10 128/82

## 2012-03-24 NOTE — Progress Notes (Signed)
  Subjective:    Patient ID: Grey Rakestraw, male    DOB: 28-Jul-1946, 66 y.o.   MRN: [id number]  HPI  Here to f/u; overall doing ok,  Pt denies chest pain, increased sob or doe, wheezing, orthopnea, PND, increased LE swelling, palpitations, dizziness or syncope.  Pt denies new neurological symptoms such as new headache, or facial or extremity weakness or numbness   Pt denies polydipsia, polyuria,   Pt states overall good compliance with meds, trying to follow lower cholesterol diet, wt overall stable but little exercise however.   Pt denies fever, wt loss, night sweats, loss of appetite, or other constitutional symptoms  Denies worsening depressive symptoms, suicidal ideation, or panic.  Overall good compliance with treatment, and good medicine tolerability, but has dizziness with viagra.  Has f/u appt with Dr Grapey/urology for prostate ca  - aug 2013.  Review of Systems     Objective:   Physical Exam        Assessment & Plan:

## 2012-06-23 ENCOUNTER — Encounter: Payer: Self-pay | Admitting: Internal Medicine

## 2012-06-23 ENCOUNTER — Ambulatory Visit (INDEPENDENT_AMBULATORY_CARE_PROVIDER_SITE_OTHER): Payer: Medicare Other | Admitting: Internal Medicine

## 2012-06-23 VITALS — BP 120/80 | HR 79 | Temp 98.1°F | Ht 65.0 in | Wt 162.0 lb

## 2012-06-23 DIAGNOSIS — F411 Generalized anxiety disorder: Secondary | ICD-10-CM

## 2012-06-23 DIAGNOSIS — M542 Cervicalgia: Secondary | ICD-10-CM | POA: Insufficient documentation

## 2012-06-23 DIAGNOSIS — M545 Low back pain: Secondary | ICD-10-CM | POA: Diagnosis not present

## 2012-06-23 DIAGNOSIS — E785 Hyperlipidemia, unspecified: Secondary | ICD-10-CM | POA: Diagnosis not present

## 2012-06-23 DIAGNOSIS — F419 Anxiety disorder, unspecified: Secondary | ICD-10-CM | POA: Insufficient documentation

## 2012-06-23 MED ORDER — NAPROXEN 500 MG PO TABS: 500.0000 mg | ORAL_TABLET | Freq: Two times a day (BID) | ORAL | Status: DC | PRN

## 2012-06-23 MED ORDER — CITALOPRAM HYDROBROMIDE 10 MG PO TABS: 10.0000 mg | ORAL_TABLET | Freq: Every day | ORAL | Status: DC

## 2012-06-23 MED ORDER — CYCLOBENZAPRINE HCL 5 MG PO TABS: 5.0000 mg | ORAL_TABLET | Freq: Three times a day (TID) | ORAL | Status: AC | PRN

## 2012-06-23 MED ORDER — LOVASTATIN 40 MG PO TABS: 40.0000 mg | ORAL_TABLET | Freq: Every day | ORAL | Status: DC

## 2012-06-23 NOTE — Assessment & Plan Note (Signed)
C/w msk strain, for flexeril prn,  to f/u any worsening symptoms or concerns 

## 2012-06-23 NOTE — Assessment & Plan Note (Signed)
With recent 5 lbs wt loss likely realted, mild overall by hx and exam, most recent data reviewed with pt, and pt to continue medical treatment as before and add  Citalopram 10 qd Lab Results  Component Value Date   WBC 8.4 03/24/2012   HGB 13.8 03/24/2012   HCT 41.2 03/24/2012   PLT 312.0 03/24/2012   GLUCOSE 80 03/24/2012   CHOL 208* 03/24/2012   TRIG 84.0 03/24/2012   HDL 55.50 03/24/2012   LDLDIRECT 147.6 03/24/2012   LDLCALC 117* 02/05/2011   ALT 22 03/24/2012   AST 22 03/24/2012   NA 141 03/24/2012   K 3.5 03/24/2012   CL 106 03/24/2012   CREATININE 0.7 03/24/2012   BUN 14 03/24/2012   CO2 25 03/24/2012   TSH 1.24 03/24/2012   PSA 6.49* 02/05/2011   INR 0.95 05/27/2011
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
## 2012-06-23 NOTE — Patient Instructions (Addendum)
Please continue your efforts at being more active, low cholesterol diet, and weight control. Start the lovastatin at 40 mg for cholesterol Continue all other medications as before Please have the pharmacy call with any refills you may need.

## 2012-06-23 NOTE — Assessment & Plan Note (Signed)
With radiation to the occiput, exam bening, ? Cervicogenic vs migraine - for naproxyn 500 bid prn, to f/u any worsening symptoms or concerns

## 2012-06-23 NOTE — Assessment & Plan Note (Signed)
Ok to start the lovastatin 40 mg - done per emr, low chol diet, f/u next mo

## 2012-07-26 DIAGNOSIS — C61 Malignant neoplasm of prostate: Secondary | ICD-10-CM | POA: Diagnosis not present

## 2012-07-29 DIAGNOSIS — C61 Malignant neoplasm of prostate: Secondary | ICD-10-CM | POA: Diagnosis not present

## 2012-07-29 DIAGNOSIS — N529 Male erectile dysfunction, unspecified: Secondary | ICD-10-CM | POA: Diagnosis not present

## 2012-09-23 ENCOUNTER — Ambulatory Visit (INDEPENDENT_AMBULATORY_CARE_PROVIDER_SITE_OTHER): Payer: Medicare Other | Admitting: Internal Medicine

## 2012-09-23 ENCOUNTER — Encounter: Payer: Self-pay | Admitting: Internal Medicine

## 2012-09-23 VITALS — BP 118/72 | HR 72 | Temp 97.4°F | Ht 65.0 in | Wt 161.1 lb

## 2012-09-23 DIAGNOSIS — I10 Essential (primary) hypertension: Secondary | ICD-10-CM | POA: Diagnosis not present

## 2012-09-23 DIAGNOSIS — F411 Generalized anxiety disorder: Secondary | ICD-10-CM | POA: Diagnosis not present

## 2012-09-23 DIAGNOSIS — E785 Hyperlipidemia, unspecified: Secondary | ICD-10-CM

## 2012-09-23 DIAGNOSIS — R361 Hematospermia: Secondary | ICD-10-CM | POA: Insufficient documentation

## 2012-09-23 DIAGNOSIS — Z23 Encounter for immunization: Secondary | ICD-10-CM

## 2012-09-23 DIAGNOSIS — F419 Anxiety disorder, unspecified: Secondary | ICD-10-CM

## 2012-09-23 MED ORDER — LISINOPRIL 10 MG PO TABS: 10.0000 mg | ORAL_TABLET | Freq: Every day | ORAL | Status: DC

## 2012-09-23 MED ORDER — LOVASTATIN 40 MG PO TABS: 40.0000 mg | ORAL_TABLET | Freq: Every day | ORAL | Status: DC

## 2012-09-23 MED ORDER — HYDROCHLOROTHIAZIDE 25 MG PO TABS: 25.0000 mg | ORAL_TABLET | Freq: Every day | ORAL | Status: DC

## 2012-09-23 NOTE — Patient Instructions (Addendum)
Take all new medications as prescribed - the cholesterol medication Continue all other medications as before All of your medications were refilled to [facility], and all should be on the $4 list You should consider getting signed up for a Medicare Part D (drug insurance plan) No blood work or other tests today You had the flu shot today Please return in 6 months, or sooner if needed Please remember to sign up for My Chart at your earliest convenience, as this will be important to you in the future with finding out test results.

## 2012-09-23 NOTE — Assessment & Plan Note (Signed)
D/w pt and wife, there was some confusion, did not start the lovastatin after last visit but willing now;  Med refilled and d/w pt LDL goal < 100, and potential side effects,  to f/u any worsening symptoms or concerns Lab Results  Component Value Date   LDLCALC 117* 02/05/2011

## 2012-09-23 NOTE — Assessment & Plan Note (Signed)
stable overall by hx and exam, most recent data reviewed with pt, and pt to continue medical treatment as before BP Readings from Last 3 Encounters:  09/23/12 118/72  06/23/12 120/80  03/24/12 142/92

## 2012-09-23 NOTE — Assessment & Plan Note (Signed)
Declines further celexa at this time,  to f/u any worsening symptoms or concerns
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
## 2012-09-23 NOTE — Assessment & Plan Note (Signed)
D/w pt and wife the benign nature of the issue, no need for specific tx or eval at this time, can also bring up with urology/dr grapey dec 2013 next visit if he likes

## 2012-11-24 DIAGNOSIS — C61 Malignant neoplasm of prostate: Secondary | ICD-10-CM | POA: Diagnosis not present

## 2012-12-02 DIAGNOSIS — C61 Malignant neoplasm of prostate: Secondary | ICD-10-CM | POA: Diagnosis not present

## 2012-12-06 ENCOUNTER — Other Ambulatory Visit: Payer: Self-pay

## 2013-03-24 ENCOUNTER — Ambulatory Visit (INDEPENDENT_AMBULATORY_CARE_PROVIDER_SITE_OTHER): Payer: Medicare Other | Admitting: Internal Medicine

## 2013-03-24 ENCOUNTER — Encounter: Payer: Self-pay | Admitting: Internal Medicine

## 2013-03-24 ENCOUNTER — Other Ambulatory Visit (INDEPENDENT_AMBULATORY_CARE_PROVIDER_SITE_OTHER): Payer: Medicare Other

## 2013-03-24 VITALS — BP 138/90 | HR 77 | Temp 98.1°F | Ht 65.0 in | Wt 162.0 lb

## 2013-03-24 DIAGNOSIS — F411 Generalized anxiety disorder: Secondary | ICD-10-CM

## 2013-03-24 DIAGNOSIS — F419 Anxiety disorder, unspecified: Secondary | ICD-10-CM

## 2013-03-24 DIAGNOSIS — E785 Hyperlipidemia, unspecified: Secondary | ICD-10-CM

## 2013-03-24 DIAGNOSIS — I10 Essential (primary) hypertension: Secondary | ICD-10-CM | POA: Diagnosis not present

## 2013-03-24 DIAGNOSIS — G4700 Insomnia, unspecified: Secondary | ICD-10-CM

## 2013-03-24 LAB — BASIC METABOLIC PANEL
BUN: 19 mg/dL (ref 6–23)
CO2: 27 mEq/L (ref 19–32)
Calcium: 9.4 mg/dL (ref 8.4–10.5)
Chloride: 104 mEq/L (ref 96–112)
Creatinine, Ser: 0.9 mg/dL (ref 0.4–1.5)
GFR: 110.67 mL/min (ref >60.00)
Glucose, Bld: 96 mg/dL (ref 70–99)
Potassium: 3.9 mEq/L (ref 3.5–5.1)
Sodium: 139 mEq/L (ref 135–145)

## 2013-03-24 LAB — CBC WITH DIFFERENTIAL/PLATELET
Basophils Relative: 0.6 % (ref 0.0–3.0)
Eosinophils Absolute: 0.3 10*3/uL (ref 0.0–0.7)
HCT: 38.6 % — ABNORMAL LOW (ref 39.0–52.0)
Hemoglobin: 12.8 g/dL — ABNORMAL LOW (ref 13.0–17.0)
Lymphs Abs: 1.7 10*3/uL (ref 0.7–4.0)
MCHC: 33.2 g/dL (ref 30.0–36.0)
MCV: 94.9 fl (ref 78.0–100.0)
Monocytes Absolute: 0.6 10*3/uL (ref 0.1–1.0)
Neutro Abs: 5.3 10*3/uL (ref 1.4–7.7)
RBC: 4.07 Mil/uL — ABNORMAL LOW (ref 4.22–5.81)

## 2013-03-24 LAB — HEPATIC FUNCTION PANEL
ALT: 18 U/L (ref 0–53)
AST: 18 U/L (ref 0–37)
Albumin: 3.9 g/dL (ref 3.5–5.2)
Alkaline Phosphatase: 60 U/L (ref 39–117)
Bilirubin, Direct: 0.1 mg/dL (ref 0.0–0.3)
Total Bilirubin: 0.5 mg/dL (ref 0.3–1.2)
Total Protein: 7.1 g/dL (ref 6.0–8.3)

## 2013-03-24 LAB — URINALYSIS, ROUTINE W REFLEX MICROSCOPIC
Bilirubin Urine: NEGATIVE
Hgb urine dipstick: NEGATIVE
Ketones, ur: NEGATIVE
Leukocytes, UA: NEGATIVE
Nitrite: NEGATIVE
Specific Gravity, Urine: 1.025 (ref 1.000–1.030)
Total Protein, Urine: NEGATIVE
Urine Glucose: NEGATIVE
Urobilinogen, UA: 0.2 (ref 0.0–1.0)
pH: 6 (ref 5.0–8.0)

## 2013-03-24 LAB — LIPID PANEL
LDL Cholesterol: 82 mg/dL (ref 0–99)
Total CHOL/HDL Ratio: 3
Triglycerides: 163 mg/dL — ABNORMAL HIGH (ref 0.0–149.0)

## 2013-03-24 MED ORDER — LOVASTATIN 40 MG PO TABS: 40.0000 mg | ORAL_TABLET | Freq: Every day | ORAL | Status: DC

## 2013-03-24 MED ORDER — ZOLPIDEM TARTRATE 5 MG PO TABS
5.0000 mg | ORAL_TABLET | Freq: Every evening | ORAL | Status: DC | PRN
Start: 2013-03-24 — End: 2013-10-18

## 2013-03-24 MED ORDER — LISINOPRIL 10 MG PO TABS: 10.0000 mg | ORAL_TABLET | Freq: Every day | ORAL | Status: DC

## 2013-03-24 MED ORDER — HYDROCHLOROTHIAZIDE 25 MG PO TABS: 25.0000 mg | ORAL_TABLET | Freq: Every day | ORAL | Status: DC

## 2013-03-24 NOTE — Assessment & Plan Note (Signed)
stable overall by history and exam, recent data reviewed with pt, and pt to continue medical treatment as before,  to f/u any worsening symptoms or concerns Lab Results  Component Value Date   WBC 7.9 03/24/2013   HGB 12.8* 03/24/2013   HCT 38.6* 03/24/2013   PLT 326.0 03/24/2013   GLUCOSE 96 03/24/2013   CHOL 166 03/24/2013   TRIG 163.0* 03/24/2013   HDL 51.20 03/24/2013   LDLDIRECT 147.6 03/24/2012   LDLCALC 82 03/24/2013   ALT 18 03/24/2013   AST 18 03/24/2013   NA 139 03/24/2013   K 3.9 03/24/2013   CL 104 03/24/2013   CREATININE 0.9 03/24/2013   BUN 19 03/24/2013   CO2 27 03/24/2013   TSH 0.92 03/24/2013   PSA 6.49* 02/05/2011   INR 0.95 05/27/2011

## 2013-03-24 NOTE — Assessment & Plan Note (Signed)
stable overall by history and exam, recent data reviewed with pt, and pt to continue medical treatment as before,  to f/u any worsening symptoms or concerns Lab Results  Component Value Date   LDLCALC 82 03/24/2013    
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
## 2013-03-24 NOTE — Assessment & Plan Note (Signed)
For ambien prn,  to f/u any worsening symptoms or concerns  

## 2013-03-24 NOTE — Patient Instructions (Addendum)
Please continue all other medications as before, and refills have been done if requested. Please have the pharmacy call with any other refills you may need. You are otherwise up to date with prevention measures today. Please continue your efforts at being more active, low cholesterol diet, and weight control. Please stop smoking Please go to the LAB in the Basement (turn left off the elevator) for the tests to be done today You will be contacted by phone if any changes need to be made immediately.  Otherwise, you will receive a letter about your results with an explanation Please remember to sign up for My Chart if you have not done so, as this will be important to you in the future with finding out test results, communicating by private email, and scheduling acute appointments online when needed.

## 2013-03-24 NOTE — Assessment & Plan Note (Signed)
stable overall by history and exam, recent data reviewed with pt, and pt to continue medical treatment as before,  to f/u any worsening symptoms or concerns BP Readings from Last 3 Encounters:  03/24/13 138/90  09/23/12 118/72  06/23/12 120/80

## 2013-03-29 DIAGNOSIS — C61 Malignant neoplasm of prostate: Secondary | ICD-10-CM | POA: Diagnosis not present

## 2013-04-05 DIAGNOSIS — C61 Malignant neoplasm of prostate: Secondary | ICD-10-CM | POA: Diagnosis not present

## 2013-09-27 ENCOUNTER — Ambulatory Visit: Payer: Medicare Other | Admitting: Internal Medicine

## 2013-10-04 DIAGNOSIS — C61 Malignant neoplasm of prostate: Secondary | ICD-10-CM | POA: Diagnosis not present

## 2013-10-11 DIAGNOSIS — C61 Malignant neoplasm of prostate: Secondary | ICD-10-CM | POA: Diagnosis not present

## 2013-10-18 ENCOUNTER — Ambulatory Visit (INDEPENDENT_AMBULATORY_CARE_PROVIDER_SITE_OTHER): Payer: Medicare Other | Admitting: Internal Medicine

## 2013-10-18 ENCOUNTER — Encounter: Payer: Self-pay | Admitting: Internal Medicine

## 2013-10-18 ENCOUNTER — Ambulatory Visit (INDEPENDENT_AMBULATORY_CARE_PROVIDER_SITE_OTHER)
Admission: RE | Admit: 2013-10-18 | Discharge: 2013-10-18 | Disposition: A | Discharge status: Home / Self Care | Payer: Medicare Other | Source: Ambulatory Visit | Attending: Internal Medicine | Admitting: Internal Medicine

## 2013-10-18 VITALS — BP 152/90 | HR 94 | Temp 98.4°F | Ht 65.0 in | Wt 162.0 lb

## 2013-10-18 DIAGNOSIS — F17200 Nicotine dependence, unspecified, uncomplicated: Secondary | ICD-10-CM

## 2013-10-18 DIAGNOSIS — J449 Chronic obstructive pulmonary disease, unspecified: Secondary | ICD-10-CM | POA: Diagnosis not present

## 2013-10-18 DIAGNOSIS — R634 Abnormal weight loss: Secondary | ICD-10-CM

## 2013-10-18 DIAGNOSIS — Z23 Encounter for immunization: Secondary | ICD-10-CM

## 2013-10-18 DIAGNOSIS — E785 Hyperlipidemia, unspecified: Secondary | ICD-10-CM | POA: Diagnosis not present

## 2013-10-18 DIAGNOSIS — G4700 Insomnia, unspecified: Secondary | ICD-10-CM

## 2013-10-18 DIAGNOSIS — I10 Essential (primary) hypertension: Secondary | ICD-10-CM

## 2013-10-18 MED ORDER — ZOLPIDEM TARTRATE 5 MG PO TABS: 5.0000 mg | ORAL_TABLET | Freq: Every evening | ORAL | Status: DC | PRN

## 2013-10-18 NOTE — Assessment & Plan Note (Signed)
I think likely related to overwork, smoking and stress but will check cxr

## 2013-10-18 NOTE — Assessment & Plan Note (Signed)
stable overall by history and exam, recent data reviewed with pt, and pt to continue medical treatment as before,  to f/u any worsening symptoms or concerns Lab Results  Component Value Date   LDLCALC 82 03/24/2013    
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
## 2013-10-18 NOTE — Assessment & Plan Note (Signed)
Ok cont ambien prn

## 2013-10-18 NOTE — Assessment & Plan Note (Signed)
stable overall by history and exam, recent data reviewed with pt, and pt to continue medical treatment as before,  to f/u any worsening symptoms or concerns BP Readings from Last 3 Encounters:  10/18/13 152/90  03/24/13 138/90  09/23/12 118/72

## 2013-10-18 NOTE — Patient Instructions (Addendum)
You had the flu shot today Please continue all other medications as before, and refills have been done if requested. Please have the pharmacy call with any other refills you may need. Please go to the XRAY Department in the Basement (go straight as you get off the elevator) for the x-ray testing You will be contacted by phone if any changes need to be made immediately.  Otherwise, you will receive a letter about your results with an explanation, but please check with MyChart first.  Please stop smoking  Please return in 6 months, or sooner if needed

## 2013-10-18 NOTE — Assessment & Plan Note (Signed)
Urged cont'd efforts to quit

## 2013-12-02 ENCOUNTER — Encounter (HOSPITAL_COMMUNITY): Payer: Self-pay | Admitting: Emergency Medicine

## 2013-12-02 ENCOUNTER — Emergency Department (HOSPITAL_COMMUNITY)
Admission: EM | Admit: 2013-12-02 | Discharge: 2013-12-02 | Disposition: A | Discharge status: Home / Self Care | Payer: Medicare Other | Attending: Emergency Medicine | Admitting: Emergency Medicine

## 2013-12-02 DIAGNOSIS — Z87828 Personal history of other (healed) physical injury and trauma: Secondary | ICD-10-CM | POA: Diagnosis not present

## 2013-12-02 DIAGNOSIS — M549 Dorsalgia, unspecified: Secondary | ICD-10-CM | POA: Diagnosis not present

## 2013-12-02 DIAGNOSIS — Z87448 Personal history of other diseases of urinary system: Secondary | ICD-10-CM | POA: Insufficient documentation

## 2013-12-02 DIAGNOSIS — Z79899 Other long term (current) drug therapy: Secondary | ICD-10-CM | POA: Insufficient documentation

## 2013-12-02 DIAGNOSIS — Z8546 Personal history of malignant neoplasm of prostate: Secondary | ICD-10-CM | POA: Diagnosis not present

## 2013-12-02 DIAGNOSIS — Z8739 Personal history of other diseases of the musculoskeletal system and connective tissue: Secondary | ICD-10-CM | POA: Diagnosis not present

## 2013-12-02 DIAGNOSIS — M545 Low back pain: Secondary | ICD-10-CM | POA: Diagnosis not present

## 2013-12-02 DIAGNOSIS — K4090 Unilateral inguinal hernia, without obstruction or gangrene, not specified as recurrent: Secondary | ICD-10-CM

## 2013-12-02 DIAGNOSIS — I10 Essential (primary) hypertension: Secondary | ICD-10-CM | POA: Insufficient documentation

## 2013-12-02 DIAGNOSIS — F17200 Nicotine dependence, unspecified, uncomplicated: Secondary | ICD-10-CM | POA: Diagnosis not present

## 2013-12-02 DIAGNOSIS — E785 Hyperlipidemia, unspecified: Secondary | ICD-10-CM | POA: Insufficient documentation

## 2013-12-02 DIAGNOSIS — Z8659 Personal history of other mental and behavioral disorders: Secondary | ICD-10-CM | POA: Insufficient documentation

## 2013-12-02 LAB — COMPREHENSIVE METABOLIC PANEL
ALT: 19 U/L (ref 0–53)
Albumin: 3.8 g/dL (ref 3.5–5.2)
Alkaline Phosphatase: 72 U/L (ref 39–117)
CO2: 27 mEq/L (ref 19–32)
Calcium: 9.6 mg/dL (ref 8.4–10.5)
GFR calc Af Amer: >90 mL/min (ref >90)
GFR calc non Af Amer: 83 mL/min — ABNORMAL LOW (ref >90)
Glucose, Bld: 98 mg/dL (ref 70–99)
Potassium: 3.8 mEq/L (ref 3.5–5.1)
Sodium: 140 mEq/L (ref 135–145)
Total Protein: 7.2 g/dL (ref 6.0–8.3)

## 2013-12-02 LAB — CBC WITH DIFFERENTIAL/PLATELET
Basophils Relative: 1 % (ref 0–1)
Eosinophils Absolute: 0.2 10*3/uL (ref 0.0–0.7)
Eosinophils Relative: 3 % (ref 0–5)
Lymphs Abs: 1.5 10*3/uL (ref 0.7–4.0)
MCH: 32.2 pg (ref 26.0–34.0)
MCHC: 34.5 g/dL (ref 30.0–36.0)
MCV: 93.2 fL (ref 78.0–100.0)
Neutrophils Relative %: 69 % (ref 43–77)
Platelets: 316 10*3/uL (ref 150–400)
RBC: 3.95 MIL/uL — ABNORMAL LOW (ref 4.22–5.81)
RDW: 13.8 % (ref 11.5–15.5)

## 2013-12-02 LAB — LIPASE, BLOOD: Lipase: 22 U/L (ref 11–59)

## 2013-12-02 LAB — URINALYSIS, ROUTINE W REFLEX MICROSCOPIC
Bilirubin Urine: NEGATIVE
Hgb urine dipstick: NEGATIVE
Nitrite: NEGATIVE
Specific Gravity, Urine: 1.016 (ref 1.005–1.030)
Urobilinogen, UA: 1 mg/dL (ref 0.0–1.0)
pH: 6 (ref 5.0–8.0)

## 2013-12-02 MED ORDER — IBUPROFEN 600 MG PO TABS: 600.0000 mg | ORAL_TABLET | Freq: Four times a day (QID) | ORAL | Status: DC | PRN

## 2013-12-02 MED ORDER — SODIUM CHLORIDE 0.9 % IV BOLUS (SEPSIS)
1000.0000 mL | Freq: Once | INTRAVENOUS | Status: AC
  Administered 2013-12-02: 1000 mL via INTRAVENOUS

## 2013-12-02 MED ORDER — MORPHINE SULFATE 4 MG/ML IJ SOLN
4.0000 mg | Freq: Once | INTRAMUSCULAR | Status: AC
  Administered 2013-12-02: 4 mg via INTRAVENOUS
  Filled 2013-12-02: qty 1

## 2013-12-02 MED ORDER — TRAMADOL HCL 50 MG PO TABS: 50.0000 mg | ORAL_TABLET | Freq: Four times a day (QID) | ORAL | Status: DC | PRN
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
## 2013-12-02 NOTE — ED Notes (Signed)
MD at bedside. 

## 2013-12-02 NOTE — ED Notes (Addendum)
Pt c/o intermittent bilateral lower back pain radiating into lower abdomen.  Pain score 10/10.  Denies GU issues and injury.  Sts "I wear a backpack for the job I do and the pain started when I put it on."

## 2013-12-28 ENCOUNTER — Encounter: Payer: Self-pay | Admitting: Internal Medicine

## 2013-12-28 ENCOUNTER — Ambulatory Visit (INDEPENDENT_AMBULATORY_CARE_PROVIDER_SITE_OTHER): Payer: Medicare Other | Admitting: Internal Medicine

## 2013-12-28 VITALS — BP 142/98 | HR 78 | Temp 97.5°F | Ht 65.0 in | Wt 166.8 lb

## 2013-12-28 DIAGNOSIS — M545 Low back pain: Secondary | ICD-10-CM

## 2013-12-28 DIAGNOSIS — I10 Essential (primary) hypertension: Secondary | ICD-10-CM

## 2013-12-28 DIAGNOSIS — K4090 Unilateral inguinal hernia, without obstruction or gangrene, not specified as recurrent: Secondary | ICD-10-CM

## 2013-12-28 MED ORDER — LISINOPRIL 20 MG PO TABS: 20.0000 mg | ORAL_TABLET | Freq: Every day | ORAL | Status: DC

## 2013-12-28 NOTE — Progress Notes (Signed)
Pre visit review using our clinic review tool, if applicable. No additional management support is needed unless otherwise documented below in the visit note. 

## 2013-12-28 NOTE — Assessment & Plan Note (Addendum)
stable overall by history and exam, recent data reviewed with pt, and pt to increase the lisinopril to 20 qd,  to f/u any worsening symptoms or concerns BP Readings from Last 3 Encounters:  12/28/13 142/98  12/02/13 148/83  10/18/13 152/90
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
## 2013-12-28 NOTE — Patient Instructions (Addendum)
You will be contacted regarding the referral for: General Surgury for the hernia OK to increase the lisinopril to 20 mg per day You can use the pain medications from the ER prescriptions for you back pain as well Please continue all other medications as before Please have the pharmacy call with any other refills you may need.  Please remember to sign up for My Chart if you have not done so, as this will be important to you in the future with finding out test results, communicating by private email, and scheduling acute appointments online when needed.

## 2013-12-28 NOTE — Assessment & Plan Note (Signed)
D/w pt, though asympt, would likely do better in the long run with elective repair, for gen surgury referral

## 2013-12-28 NOTE — Assessment & Plan Note (Addendum)
C/w prob underlying lumbar djd/ddd flare related to work and lifting, exam, ok for meds as rx in recent ER visit

## 2014-01-11 ENCOUNTER — Ambulatory Visit (INDEPENDENT_AMBULATORY_CARE_PROVIDER_SITE_OTHER): Payer: Medicare Other | Admitting: General Surgery

## 2014-01-11 ENCOUNTER — Encounter (INDEPENDENT_AMBULATORY_CARE_PROVIDER_SITE_OTHER): Payer: Self-pay | Admitting: General Surgery

## 2014-01-11 VITALS — BP 126/92 | HR 72 | Temp 98.2°F | Resp 14 | Ht 65.0 in | Wt 162.6 lb

## 2014-01-11 DIAGNOSIS — K4090 Unilateral inguinal hernia, without obstruction or gangrene, not specified as recurrent: Secondary | ICD-10-CM | POA: Diagnosis not present
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
## 2014-01-11 NOTE — Patient Instructions (Signed)
You have a small left inguinal hernia. This is probably causing most of the pain in your groin and lower abdomen. It is not causing the back pain.  We have discussed this in detail. You have stated that he would like to go ahead and have this repaired at this time before it gets any bigger. That is reasonable.  You will be scheduled for open repair of left inguinal hernia with mesh.  No sports or lifting more than 15 pounds for 5-6 weeks after the surgery.  You may drive your car in 7 days after the surgery.     Inguinal Hernia, Adult  Care After Refer to this sheet in the next few weeks. These discharge instructions provide you with general information on caring for yourself after you leave the hospital. Your caregiver may also give you specific instructions. Your treatment has been planned according to the most current medical practices available, but unavoidable complications sometimes occur. If you have any problems or questions after discharge, please call your caregiver. HOME CARE INSTRUCTIONS  Put ice on the operative site.  Put ice in a plastic bag.  Place a towel between your skin and the bag.  Leave the ice on for 15-20 minutes at a time, 03-04 times a day while awake.  Change bandages (dressings) as directed.  Keep the wound dry and clean. The wound may be washed gently with soap and water. Gently blot or dab the wound dry. It is okay to take showers 24 to 48 hours after surgery. Do not take baths, use swimming pools, or use hot tubs for 10 days, or as directed by your caregiver.  Only take over-the-counter or prescription medicines for pain, discomfort, or fever as directed by your caregiver.  Continue your normal diet as directed.  Do not lift anything more than 10 pounds or play contact sports for 3 weeks, or as directed. SEEK MEDICAL CARE IF:  There is redness, swelling, or increasing pain in the wound.  There is fluid (pus) coming from the wound.  There is  drainage from a wound lasting longer than 1 day.  You have an oral temperature above 102 F (38.9 C).  You notice a bad smell coming from the wound or dressing.  The wound breaks open after the stitches (sutures) have been removed.  You notice increasing pain in the shoulders (shoulder strap areas).  You develop dizzy episodes or fainting while standing.  You feel sick to your stomach (nauseous) or throw up (vomit). SEEK IMMEDIATE MEDICAL CARE IF:  You develop a rash.  You have difficulty breathing.  You develop a reaction or have side effects to medicines you were given. MAKE SURE YOU:   Understand these instructions.  Will watch your condition.  Will get help right away if you are not doing well or get worse. Document Released: 01/15/2007 Document Revised: 03/08/2012 Document Reviewed: 11/14/2009 [facility] Patient Information 2014 [place], [place].      Inguinal Hernia, Adult Muscles help keep everything in the body in its proper place. But if a weak spot in the muscles develops, something can poke through. That is called a hernia. When this happens in the lower part of the belly (abdomen), it is called an inguinal hernia. (It takes its name from a part of the body in this region called the inguinal canal.) A weak spot in the wall of muscles lets some fat or part of the small intestine bulge through. An inguinal hernia can develop at any age. Men  get them more often than women. CAUSES  In adults, an inguinal hernia develops over time.  It can be triggered by:  Suddenly straining the muscles of the lower abdomen.  Lifting heavy objects.  Straining to have a bowel movement. Difficult bowel movements (constipation) can lead to this.  Constant coughing. This may be caused by smoking or lung disease.  Being overweight.  Being pregnant.  Working at a job that requires long periods of standing or heavy lifting.  Having had an inguinal hernia before. One type can be  an emergency situation. It is called a strangulated inguinal hernia. It develops if part of the small intestine slips through the weak spot and cannot get back into the abdomen. The blood supply can be cut off. If that happens, part of the intestine may die. This situation requires emergency surgery. SYMPTOMS  Often, a small inguinal hernia has no symptoms. It is found when a healthcare provider does a physical exam. Larger hernias usually have symptoms.   In adults, symptoms may include:  A lump in the groin. This is easier to see when the person is standing. It might disappear when lying down.  In men, a lump in the scrotum.  Pain or burning in the groin. This occurs especially when lifting, straining or coughing.  A dull ache or feeling of pressure in the groin.  Signs of a strangulated hernia can include:  A bulge in the groin that becomes very painful and tender to the touch.  A bulge that turns red or purple.  Fever, nausea and vomiting.  Inability to have a bowel movement or to pass gas. DIAGNOSIS  To decide if you have an inguinal hernia, a healthcare provider will probably do a physical examination.  This will include asking questions about any symptoms you have noticed.  The healthcare provider might feel the groin area and ask you to cough. If an inguinal hernia is felt, the healthcare provider may try to slide it back into the abdomen.  Usually no other tests are needed. TREATMENT  Treatments can vary. The size of the hernia makes a difference. Options include:  Watchful waiting. This is often suggested if the hernia is small and you have had no symptoms.  No medical procedure will be done unless symptoms develop.  You will need to watch closely for symptoms. If any occur, contact your healthcare provider right away.  Surgery. This is used if the hernia is larger or you have symptoms.  Open surgery. This is usually an outpatient procedure (you will not stay  overnight in a hospital). An cut (incision) is made through the skin in the groin. The hernia is put back inside the abdomen. The weak area in the muscles is then repaired by herniorrhaphy or hernioplasty. Herniorrhaphy: in this type of surgery, the weak muscles are sewn back together. Hernioplasty: a patch or mesh is used to close the weak area in the abdominal wall.  Laparoscopy. In this procedure, a surgeon makes small incisions. A thin tube with a tiny video camera (called a laparoscope) is put into the abdomen. The surgeon repairs the hernia with mesh by looking with the video camera and using two long instruments. HOME CARE INSTRUCTIONS   After surgery to repair an inguinal hernia:  You will need to take pain medicine prescribed by your healthcare provider. Follow all directions carefully.  You will need to take care of the wound from the incision.  Your activity will be restricted for awhile. This  will probably include no heavy lifting for several weeks. You also should not do anything too active for a few weeks. When you can return to work will depend on the type of job that you have.  During "watchful waiting" periods, you should:  Maintain a healthy weight.  Eat a diet high in fiber (fruits, vegetables and whole grains).  Drink plenty of fluids to avoid constipation. This means drinking enough water and other liquids to keep your urine clear or pale yellow.  Do not lift heavy objects.  Do not stand for long periods of time.  Quit smoking. This should keep you from developing a frequent cough. SEEK MEDICAL CARE IF:   A bulge develops in your groin area.  You feel pain, a burning sensation or pressure in the groin. This might be worse if you are lifting or straining.  You develop a fever of more than 100.5 F (38.1 C). SEEK IMMEDIATE MEDICAL CARE IF:   Pain in the groin increases suddenly.  A bulge in the groin gets bigger suddenly and does not go down.  For men, there  is sudden pain in the scrotum. Or, the size of the scrotum increases.  A bulge in the groin area becomes red or purple and is painful to touch.  You have nausea or vomiting that does not go away.  You feel your heart beating much faster than normal.  You cannot have a bowel movement or pass gas.  You develop a fever of more than 102.0 F (38.9 C). Document Released: 05/03/2009 Document Revised: 03/08/2012 Document Reviewed: 05/03/2009 [facility] Patient Information 2014 [place], [place].

## 2014-01-13 ENCOUNTER — Encounter (HOSPITAL_COMMUNITY): Payer: Self-pay | Admitting: Pharmacy Technician

## 2014-01-16 NOTE — Patient Instructions (Addendum)
[place]  01/16/2014   Your procedure is scheduled on: 01/20/14  Report to [facility] at 5:30 AM.  Call this number if you have problems the morning of surgery [phone]: [phone]   Remember:   Do not eat food or drink liquids After Midnight.   Do not wear jewelry, make-up or nail polish.  Do not wear lotions, powders, or perfumes. You may wear deodorant.  Do not shave 48 hours prior to surgery. Men may shave face and neck.  Do not bring valuables to the hospital.  Contacts, dentures or bridgework may not be worn into surgery.     Patients discharged the day of surgery will not be allowed to drive home.  Name and phone number of your driver: Brian Allen   Paulette Blanch, RN  pre op nurse call if needed [phone]    FAILURE TO [place] OF YOUR SURGERY   Patient Signature: ___________________________________________

## 2014-01-16 NOTE — Progress Notes (Signed)
Chest x-ray 10/18/13 on [facility]

## 2014-01-17 ENCOUNTER — Encounter (HOSPITAL_COMMUNITY)
Admission: RE | Admit: 2014-01-17 | Discharge: 2014-01-17 | Disposition: A | Discharge status: Home / Self Care | Payer: Medicare Other | Source: Ambulatory Visit | Attending: General Surgery | Admitting: General Surgery

## 2014-01-17 ENCOUNTER — Encounter (HOSPITAL_COMMUNITY): Payer: Self-pay

## 2014-01-17 DIAGNOSIS — I10 Essential (primary) hypertension: Secondary | ICD-10-CM | POA: Diagnosis not present

## 2014-01-17 DIAGNOSIS — Z79899 Other long term (current) drug therapy: Secondary | ICD-10-CM | POA: Diagnosis not present

## 2014-01-17 DIAGNOSIS — K4090 Unilateral inguinal hernia, without obstruction or gangrene, not specified as recurrent: Secondary | ICD-10-CM | POA: Diagnosis not present

## 2014-01-17 DIAGNOSIS — C61 Malignant neoplasm of prostate: Secondary | ICD-10-CM | POA: Diagnosis not present

## 2014-01-17 DIAGNOSIS — E785 Hyperlipidemia, unspecified: Secondary | ICD-10-CM | POA: Diagnosis not present

## 2014-01-17 DIAGNOSIS — Z7982 Long term (current) use of aspirin: Secondary | ICD-10-CM | POA: Diagnosis not present

## 2014-01-17 DIAGNOSIS — K219 Gastro-esophageal reflux disease without esophagitis: Secondary | ICD-10-CM | POA: Diagnosis not present

## 2014-01-17 DIAGNOSIS — F17200 Nicotine dependence, unspecified, uncomplicated: Secondary | ICD-10-CM | POA: Diagnosis not present

## 2014-01-17 HISTORY — DX: Unspecified osteoarthritis, unspecified site: M19.90

## 2014-01-17 LAB — BASIC METABOLIC PANEL
BUN: 17 mg/dL (ref 6–23)
CALCIUM: 9.4 mg/dL (ref 8.4–10.5)
CHLORIDE: 104 meq/L (ref 96–112)
CO2: 27 meq/L (ref 19–32)
Creatinine, Ser: 0.99 mg/dL (ref 0.50–1.35)
GFR calc Af Amer: >90 mL/min (ref >90)
GFR, EST NON AFRICAN AMERICAN: 82 mL/min — AB (ref >90)
Glucose, Bld: 106 mg/dL — ABNORMAL HIGH (ref 70–99)
POTASSIUM: 4.3 meq/L (ref 3.7–5.3)
Sodium: 142 mEq/L (ref 137–147)

## 2014-01-17 LAB — CBC
HEMATOCRIT: 38.2 % — AB (ref 39.0–52.0)
HEMOGLOBIN: 12.8 g/dL — AB (ref 13.0–17.0)
MCH: 31.4 pg (ref 26.0–34.0)
MCHC: 33.5 g/dL (ref 30.0–36.0)
MCV: 93.6 fL (ref 78.0–100.0)
Platelets: 344 10*3/uL (ref 150–400)
RBC: 4.08 MIL/uL — ABNORMAL LOW (ref 4.22–5.81)
RDW: 13.2 % (ref 11.5–15.5)
WBC: 6.2 10*3/uL (ref 4.0–10.5)

## 2014-01-19 NOTE — Anesthesia Preprocedure Evaluation (Addendum)
Anesthesia Evaluation  Patient identified by MRN, date of birth, ID band Patient awake    Reviewed: Allergy & Precautions, H&P , NPO status , Patient's Chart, lab work & pertinent test results  Airway Mallampati: II TM Distance: >3 FB Neck ROM: Full    Dental  (+) Poor Dentition, Missing and Loose,    Pulmonary Current Smoker,  breath sounds clear to auscultation  Pulmonary exam normal       Cardiovascular hypertension, Pt. on medications Rhythm:Regular Rate:Normal     Neuro/Psych Anxiety negative neurological ROS     GI/Hepatic negative GI ROS, GERD-  ,(+)     substance abuse  alcohol use,   Endo/Other  negative endocrine ROS  Renal/GU negative Renal ROS  negative genitourinary   Musculoskeletal negative musculoskeletal ROS (+)   Abdominal   Peds negative pediatric ROS (+)  Hematology negative hematology ROS (+)   Anesthesia Other Findings   Reproductive/Obstetrics negative OB ROS                          Anesthesia Physical Anesthesia Plan  ASA: II  Anesthesia Plan: General   Post-op Pain Management:    Induction: Intravenous  Airway Management Planned: Oral ETT  Additional Equipment:   Intra-op Plan:   Post-operative Plan: Extubation in OR  Informed Consent: I have reviewed the patients History and Physical, chart, labs and discussed the procedure including the risks, benefits and alternatives for the proposed anesthesia with the patient or authorized representative who has indicated his/her understanding and acceptance.   Dental advisory given  Plan Discussed with: CRNA  Anesthesia Plan Comments:         Anesthesia Quick Evaluation
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
## 2014-01-20 ENCOUNTER — Encounter (HOSPITAL_COMMUNITY)
Admission: RE | Disposition: A | Discharge status: Home / Self Care | Payer: Self-pay | Source: Ambulatory Visit | Attending: General Surgery

## 2014-01-20 ENCOUNTER — Encounter (HOSPITAL_COMMUNITY): Payer: Medicare Other | Admitting: Certified Registered Nurse Anesthetist

## 2014-01-20 ENCOUNTER — Ambulatory Visit (HOSPITAL_COMMUNITY)
Admission: RE | Admit: 2014-01-20 | Discharge: 2014-01-20 | Disposition: A | Discharge status: Home / Self Care | Payer: Medicare Other | Source: Ambulatory Visit | Attending: General Surgery | Admitting: General Surgery

## 2014-01-20 ENCOUNTER — Encounter (HOSPITAL_COMMUNITY): Payer: Self-pay | Admitting: *Deleted

## 2014-01-20 ENCOUNTER — Ambulatory Visit (HOSPITAL_COMMUNITY): Payer: Medicare Other | Admitting: Certified Registered Nurse Anesthetist

## 2014-01-20 DIAGNOSIS — Z79899 Other long term (current) drug therapy: Secondary | ICD-10-CM | POA: Insufficient documentation

## 2014-01-20 DIAGNOSIS — K219 Gastro-esophageal reflux disease without esophagitis: Secondary | ICD-10-CM | POA: Insufficient documentation

## 2014-01-20 DIAGNOSIS — C61 Malignant neoplasm of prostate: Secondary | ICD-10-CM | POA: Insufficient documentation

## 2014-01-20 DIAGNOSIS — K4090 Unilateral inguinal hernia, without obstruction or gangrene, not specified as recurrent: Secondary | ICD-10-CM | POA: Insufficient documentation

## 2014-01-20 DIAGNOSIS — E785 Hyperlipidemia, unspecified: Secondary | ICD-10-CM | POA: Insufficient documentation

## 2014-01-20 DIAGNOSIS — F17200 Nicotine dependence, unspecified, uncomplicated: Secondary | ICD-10-CM | POA: Insufficient documentation

## 2014-01-20 DIAGNOSIS — I10 Essential (primary) hypertension: Secondary | ICD-10-CM | POA: Insufficient documentation

## 2014-01-20 DIAGNOSIS — Z7982 Long term (current) use of aspirin: Secondary | ICD-10-CM | POA: Insufficient documentation

## 2014-01-20 HISTORY — PX: INGUINAL HERNIA REPAIR: SHX194

## 2014-01-20 HISTORY — PX: INSERTION OF MESH: SHX5868

## 2014-01-20 SURGERY — REPAIR, HERNIA, INGUINAL, ADULT
Anesthesia: General | Site: Groin | Laterality: Left

## 2014-01-20 MED ORDER — ONDANSETRON HCL 4 MG/2ML IJ SOLN
INTRAMUSCULAR | Status: AC
  Filled 2014-01-20: qty 2

## 2014-01-20 MED ORDER — PHENYLEPHRINE 40 MCG/ML (10ML) SYRINGE FOR IV PUSH (FOR BLOOD PRESSURE SUPPORT)
PREFILLED_SYRINGE | INTRAVENOUS | Status: AC
  Filled 2014-01-20: qty 10

## 2014-01-20 MED ORDER — ONDANSETRON HCL 4 MG/2ML IJ SOLN: 4.0000 mg | Freq: Four times a day (QID) | INTRAMUSCULAR | Status: DC | PRN

## 2014-01-20 MED ORDER — CEFAZOLIN SODIUM-DEXTROSE 2-3 GM-% IV SOLR
2.0000 g | INTRAVENOUS | Status: AC
  Administered 2014-01-20: 2 g via INTRAVENOUS

## 2014-01-20 MED ORDER — BUPIVACAINE-EPINEPHRINE PF 0.25-1:200000 % IJ SOLN
INTRAMUSCULAR | Status: AC
  Filled 2014-01-20: qty 30

## 2014-01-20 MED ORDER — ATROPINE SULFATE 0.4 MG/ML IJ SOLN
INTRAMUSCULAR | Status: AC
  Filled 2014-01-20: qty 1

## 2014-01-20 MED ORDER — LACTATED RINGERS IV SOLN
INTRAVENOUS | Status: DC | PRN
  Administered 2014-01-20 (×2): via INTRAVENOUS

## 2014-01-20 MED ORDER — LACTATED RINGERS IV SOLN: INTRAVENOUS | Status: DC

## 2014-01-20 MED ORDER — HYDROMORPHONE HCL PF 1 MG/ML IJ SOLN: 0.2500 mg | INTRAMUSCULAR | Status: DC | PRN

## 2014-01-20 MED ORDER — SODIUM CHLORIDE 0.9 % IJ SOLN
INTRAMUSCULAR | Status: AC
  Filled 2014-01-20: qty 10

## 2014-01-20 MED ORDER — MIDAZOLAM HCL 2 MG/2ML IJ SOLN
INTRAMUSCULAR | Status: AC
  Filled 2014-01-20: qty 2

## 2014-01-20 MED ORDER — ROCURONIUM BROMIDE 100 MG/10ML IV SOLN
INTRAVENOUS | Status: DC | PRN
  Administered 2014-01-20: 20 mg via INTRAVENOUS

## 2014-01-20 MED ORDER — FENTANYL CITRATE 0.05 MG/ML IJ SOLN
INTRAMUSCULAR | Status: DC | PRN
  Administered 2014-01-20 (×6): 50 ug via INTRAVENOUS

## 2014-01-20 MED ORDER — BUPIVACAINE-EPINEPHRINE 0.5% -1:200000 IJ SOLN
INTRAMUSCULAR | Status: DC | PRN
  Administered 2014-01-20: 13 mL

## 2014-01-20 MED ORDER — OXYCODONE HCL 5 MG PO TABS
5.0000 mg | ORAL_TABLET | ORAL | Status: DC | PRN
  Administered 2014-01-20: 5 mg via ORAL
  Filled 2014-01-20: qty 1

## 2014-01-20 MED ORDER — PHENYLEPHRINE 40 MCG/ML (10ML) SYRINGE FOR IV PUSH (FOR BLOOD PRESSURE SUPPORT)
PREFILLED_SYRINGE | INTRAVENOUS | Status: AC
  Filled 2014-01-20: qty 20

## 2014-01-20 MED ORDER — PROMETHAZINE HCL 25 MG/ML IJ SOLN: 6.2500 mg | INTRAMUSCULAR | Status: DC | PRN

## 2014-01-20 MED ORDER — PROPOFOL 10 MG/ML IV BOLUS
INTRAVENOUS | Status: AC
  Filled 2014-01-20: qty 20

## 2014-01-20 MED ORDER — SODIUM CHLORIDE 0.9 % IV SOLN: INTRAVENOUS | Status: DC

## 2014-01-20 MED ORDER — DEXAMETHASONE SODIUM PHOSPHATE 10 MG/ML IJ SOLN
INTRAMUSCULAR | Status: DC | PRN
  Administered 2014-01-20: 10 mg via INTRAVENOUS

## 2014-01-20 MED ORDER — 0.9 % SODIUM CHLORIDE (POUR BTL) OPTIME
TOPICAL | Status: DC | PRN
  Administered 2014-01-20: 1000 mL

## 2014-01-20 MED ORDER — GLYCOPYRROLATE 0.2 MG/ML IJ SOLN
INTRAMUSCULAR | Status: AC
  Filled 2014-01-20: qty 3

## 2014-01-20 MED ORDER — CEFAZOLIN SODIUM-DEXTROSE 2-3 GM-% IV SOLR
INTRAVENOUS | Status: AC
  Filled 2014-01-20: qty 50

## 2014-01-20 MED ORDER — LIDOCAINE HCL (CARDIAC) 20 MG/ML IV SOLN
INTRAVENOUS | Status: DC | PRN
  Administered 2014-01-20: 70 mg via INTRAVENOUS

## 2014-01-20 MED ORDER — EPHEDRINE SULFATE 50 MG/ML IJ SOLN
INTRAMUSCULAR | Status: AC
  Filled 2014-01-20: qty 1

## 2014-01-20 MED ORDER — ONDANSETRON HCL 4 MG/2ML IJ SOLN
INTRAMUSCULAR | Status: DC | PRN
  Administered 2014-01-20: 4 mg via INTRAVENOUS

## 2014-01-20 MED ORDER — ACETAMINOPHEN 325 MG PO TABS: 650.0000 mg | ORAL_TABLET | ORAL | Status: DC | PRN

## 2014-01-20 MED ORDER — GLYCOPYRROLATE 0.2 MG/ML IJ SOLN
INTRAMUSCULAR | Status: DC | PRN
  Administered 2014-01-20: 0.6 mg via INTRAVENOUS

## 2014-01-20 MED ORDER — FENTANYL CITRATE 0.05 MG/ML IJ SOLN
INTRAMUSCULAR | Status: AC
  Filled 2014-01-20: qty 2

## 2014-01-20 MED ORDER — HYDROCODONE-ACETAMINOPHEN 5-325 MG PO TABS: 1.0000 | ORAL_TABLET | ORAL | Status: DC | PRN

## 2014-01-20 MED ORDER — SODIUM CHLORIDE 0.9 % IJ SOLN: 3.0000 mL | Freq: Two times a day (BID) | INTRAMUSCULAR | Status: DC

## 2014-01-20 MED ORDER — FENTANYL CITRATE 0.05 MG/ML IJ SOLN
INTRAMUSCULAR | Status: AC
  Filled 2014-01-20: qty 5

## 2014-01-20 MED ORDER — ACETAMINOPHEN 650 MG RE SUPP
650.0000 mg | RECTAL | Status: DC | PRN
  Filled 2014-01-20: qty 1

## 2014-01-20 MED ORDER — LIDOCAINE HCL (CARDIAC) 20 MG/ML IV SOLN
INTRAVENOUS | Status: AC
  Filled 2014-01-20: qty 5

## 2014-01-20 MED ORDER — NEOSTIGMINE METHYLSULFATE 1 MG/ML IJ SOLN
INTRAMUSCULAR | Status: AC
  Filled 2014-01-20: qty 10

## 2014-01-20 MED ORDER — SODIUM CHLORIDE 0.9 % IV SOLN: 250.0000 mL | INTRAVENOUS | Status: DC | PRN

## 2014-01-20 MED ORDER — PHENYLEPHRINE HCL 10 MG/ML IJ SOLN
INTRAMUSCULAR | Status: DC | PRN
  Administered 2014-01-20 (×5): 80 ug via INTRAVENOUS

## 2014-01-20 MED ORDER — FENTANYL CITRATE 0.05 MG/ML IJ SOLN: 25.0000 ug | INTRAMUSCULAR | Status: DC | PRN

## 2014-01-20 MED ORDER — MIDAZOLAM HCL 5 MG/5ML IJ SOLN
INTRAMUSCULAR | Status: DC | PRN
  Administered 2014-01-20 (×2): 1 mg via INTRAVENOUS

## 2014-01-20 MED ORDER — ROCURONIUM BROMIDE 100 MG/10ML IV SOLN
INTRAVENOUS | Status: AC
  Filled 2014-01-20: qty 1

## 2014-01-20 MED ORDER — SODIUM CHLORIDE 0.9 % IJ SOLN: 3.0000 mL | INTRAMUSCULAR | Status: DC | PRN

## 2014-01-20 MED ORDER — EPHEDRINE SULFATE 50 MG/ML IJ SOLN
INTRAMUSCULAR | Status: DC | PRN
  Administered 2014-01-20: 5 mg via INTRAVENOUS
  Administered 2014-01-20: 10 mg via INTRAVENOUS

## 2014-01-20 MED ORDER — DEXAMETHASONE SODIUM PHOSPHATE 10 MG/ML IJ SOLN
INTRAMUSCULAR | Status: AC
  Filled 2014-01-20: qty 1

## 2014-01-20 MED ORDER — PROPOFOL 10 MG/ML IV BOLUS
INTRAVENOUS | Status: DC | PRN
  Administered 2014-01-20: 20 mg via INTRAVENOUS
  Administered 2014-01-20: 180 mg via INTRAVENOUS

## 2014-01-20 SURGICAL SUPPLY — 47 items
ADH SKN CLS APL DERMABOND .7 (GAUZE/BANDAGES/DRESSINGS) ×1
APL SKNCLS STERI-STRIP NONHPOA (GAUZE/BANDAGES/DRESSINGS)
BENZOIN TINCTURE PRP APPL 2/3 (GAUZE/BANDAGES/DRESSINGS) IMPLANT
BLADE HEX COATED 2.75 (ELECTRODE) ×3 IMPLANT
BLADE SURG 15 STRL LF DISP TIS (BLADE) ×1 IMPLANT
BLADE SURG 15 STRL SS (BLADE) ×3
CANISTER SUCTION 2500CC (MISCELLANEOUS) ×1 IMPLANT
CHLORAPREP W/TINT 26ML (MISCELLANEOUS) ×3 IMPLANT
CLOSURE WOUND 1/2 X4 (GAUZE/BANDAGES/DRESSINGS)
DECANTER SPIKE VIAL GLASS SM (MISCELLANEOUS) ×3 IMPLANT
DERMABOND ADVANCED (GAUZE/BANDAGES/DRESSINGS) ×2
DERMABOND ADVANCED .7 DNX12 (GAUZE/BANDAGES/DRESSINGS) IMPLANT
DISSECTOR ROUND CHERRY 3/8 STR (MISCELLANEOUS) ×2 IMPLANT
DRAIN PENROSE 18X1/2 LTX STRL (DRAIN) ×2 IMPLANT
DRAPE LAPAROTOMY TRNSV 102X78 (DRAPE) ×3 IMPLANT
DRAPE UTILITY XL STRL (DRAPES) ×2 IMPLANT
ELECT REM PT RETURN 9FT ADLT (ELECTROSURGICAL) ×3
ELECTRODE REM PT RTRN 9FT ADLT (ELECTROSURGICAL) ×1 IMPLANT
GLOVE EUDERMIC 7 POWDERFREE (GLOVE) ×3 IMPLANT
GOWN STRL REUS W/TWL XL LVL3 (GOWN DISPOSABLE) ×6 IMPLANT
KIT BASIN OR (CUSTOM PROCEDURE TRAY) ×3 IMPLANT
MESH ULTRAPRO 3X6 7.6X15CM (Mesh General) ×2 IMPLANT
NDL HYPO 25X1 1.5 SAFETY (NEEDLE) ×1 IMPLANT
NEEDLE HYPO 25X1 1.5 SAFETY (NEEDLE) ×3 IMPLANT
PACK BASIC VI WITH GOWN DISP (CUSTOM PROCEDURE TRAY) ×3 IMPLANT
PENCIL BUTTON HOLSTER BLD 10FT (ELECTRODE) ×3 IMPLANT
SPONGE GAUZE 4X4 12PLY (GAUZE/BANDAGES/DRESSINGS) IMPLANT
SPONGE LAP 4X18 X RAY DECT (DISPOSABLE) ×3 IMPLANT
STAPLER VISISTAT 35W (STAPLE) IMPLANT
STRIP CLOSURE SKIN 1/2X4 (GAUZE/BANDAGES/DRESSINGS) IMPLANT
SUT MNCRL AB 4-0 PS2 18 (SUTURE) ×3 IMPLANT
SUT PROLENE 2 0 CT2 30 (SUTURE) ×8 IMPLANT
SUT SILK 2 0 (SUTURE) ×3
SUT SILK 2 0 SH (SUTURE) IMPLANT
SUT SILK 2-0 18XBRD TIE 12 (SUTURE) IMPLANT
SUT VIC AB 2-0 SH 27 (SUTURE) ×6
SUT VIC AB 2-0 SH 27X BRD (SUTURE) ×1 IMPLANT
SUT VIC AB 3-0 SH 27 (SUTURE) ×3
SUT VIC AB 3-0 SH 27XBRD (SUTURE) ×1 IMPLANT
SUT VICRYL 2 0 18  UND BR (SUTURE)
SUT VICRYL 2 0 18 UND BR (SUTURE) IMPLANT
SYR 20CC LL (SYRINGE) ×3 IMPLANT
SYR BULB IRRIGATION 50ML (SYRINGE) ×3 IMPLANT
TOWEL OR 17X26 10 PK STRL BLUE (TOWEL DISPOSABLE) ×3 IMPLANT
TOWEL OR NON WOVEN STRL DISP B (DISPOSABLE) ×3 IMPLANT
TRAY FOLEY CATH 16FRSI W/METER (SET/KITS/TRAYS/PACK) IMPLANT
YANKAUER SUCT BULB TIP 10FT TU (MISCELLANEOUS) IMPLANT

## 2014-01-20 NOTE — Transfer of Care (Signed)
Immediate Anesthesia Transfer of Care Note  Patient: Brian Allen.  Procedure(s) Performed: Procedure(s) (LRB): HERNIA REPAIR INGUINAL ADULT (Left) INSERTION OF MESH (Left)  Patient Location: PACU  Anesthesia Type: General  Level of Consciousness: sedated, patient cooperative and responds to stimulation  Airway & Oxygen Therapy: Patient Spontanous Breathing and Patient connected to face mask oxgen  Post-op Assessment: Report given to PACU RN and Post -op Vital signs reviewed and stable  Post vital signs: Reviewed and stable  Complications: No apparent anesthesia complications

## 2014-01-20 NOTE — Op Note (Signed)
Patient Name:           Brian Allen.   Date of Surgery:        01/20/2014  Pre op Diagnosis:      Left inguinal hernia  Post op Diagnosis:    Same  Procedure:                 Open repair left inguinal hernia with mesh  Surgeon:                     Edsel Petrin. Dalbert Batman, M.D., FACS  Assistant:                      None  Operative Indications:   Brian Allen is a 68 y.o. male. He is referred by Dr. Cathlean Cower for evaluation of left inguinal hernia.  The patient has no prior history of hernia. 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 After doing some heavy lifting recently he had some lower abdominal pain, radiating down to his groin. This did not radiate into his penis or testicle. He also complained of back pain. He went to the emergency department and was diagnosed with a left inguinal hernia. He saw Dr. Jenny Reichmann and found a small left inguinal hernia. My exam reveals a small reducible left inguinal hernia, slightly tender. No hernia on the right. He is brought to the operating room electively.  Comorbidities include tobacco abuse. Remote alcohol abuse, no drinking for 30 years, prostate cancer treated with seed implantation, Dr. Risa Grill.. GERD. Hypertension. Hyperlipidemia.   Operative Findings:       The patient had a small indirect left inguinal hernia and a lipoma associated with the cord. The direct space was bulging somewhat but was not enlarged or well-defined direct hernia. I could not feel any evidence of hernia in the femoral space.  Procedure in Detail:          Following the induction of general LMA anesthesia the patient's abdomen and genitalia were prepped and draped in a sterile fashion. Intravenous antibiotics were given and a surgical time out was performed. 0.5% Marcaine with epinephrine was used as a local infiltration in all of the tissue layers. A transverse incision was made in the left inguinal area overlying the inguinal  canal. Dissection was carried down to the external oblique. The external oblique was incised in the direction of its fibers opening up the external inguinal ring. Self-retaining retractors were placed. Cord structures were mobilized and encircled with a Penrose drain.    Cord structures were skeletonized, dividing the cremasteric muscle fibers. Small indirect sac was reduced. Medium sized lipoma was dissected away from all the other structures, clamped at the level of the internal ring, amputated, discarded, and ligated with 2-0 Vicryl tie. Sensory nerve was intimately associated with the cord structures were just was dissected back laterally to its emergence from the muscle, clamped, divided, tied off with 2-0 silk tie and the redundant nerve medially resected. I inspected the cord very carefully and found no other hernia. There was somewhat of a bulge at the direct space but not large. The floor of the inguinal canal was repaired and reinforced with a 3" x 6" piece of Ultra-Pro mesh, trimmed at the corners to accommodate the anatomy of the wound. The mesh was sutured so as to generously overlap the fascia at the pubic tubercle. 2-0 Prolene was used. The mesh was sutured to  the inguinal ligament with a running suture of 2-0 Prolene. Medially, superiorly, and superolaterally several interrupted mattress sutures of 2-0 Prolene were placed. The mesh was incised laterally so as to wrap around the cord structures at the internal ring. Further sutures were placed laterally. This provided very secure repair both medial and lateral to the internal ring to allow adequate fingertip opening for the cord structures. The repair looked good. Hemostasis excellent. The wound was irrigated with saline. The external oblique was closed with a running suture of 2-0 Vicryl placing the cord structures deep to the external oblique. Scarpa's fascia was closed with 3-0 Vicryl and the skin closed with a running subcuticular suture for  Monocryl and Dermabond. The patient tolerated the procedure well was taken to PACU in stable condition. EBL 10 cc. Counts correct. Complications none.     Edsel Petrin. Dalbert Batman, M.D., FACS General and Minimally Invasive Surgery Breast and Colorectal Surgery  01/20/2014 8:48 AM

## 2014-01-20 NOTE — Interval H&P Note (Signed)
History and Physical Interval Note:  01/20/2014 7:21 AM  Brian Allen.  has presented today for surgery, with the diagnosis of left inguinal hernia  The goals and the various methods of treatment have been discussed with the patient and family. After consideration of risks, benefits and other options for treatment, the patient has consented to  Procedure(s): HERNIA REPAIR INGUINAL ADULT (Left) INSERTION OF MESH (N/A) as a surgical intervention .  The patient's history has been reviewed, patient examined, no change in status, stable for surgery.  I have reviewed the patient's chart and labs.  Questions were answered to the patient's satisfaction.     [name]

## 2014-01-20 NOTE — Anesthesia Postprocedure Evaluation (Signed)
Anesthesia Post Note  Patient: Brian Allen.  Procedure(s) Performed: Procedure(s) (LRB): HERNIA REPAIR INGUINAL ADULT (Left) INSERTION OF MESH (Left)  Anesthesia type: General  Patient location: PACU  Post pain: Pain level controlled  Post assessment: Post-op Vital signs reviewed  Last Vitals:  Filed Vitals:   01/20/14 1051  BP: 138/76  Pulse: 46  Temp:   Resp: 16    Post vital signs: Reviewed  Level of consciousness: sedated  Complications: No apparent anesthesia complications

## 2014-01-20 NOTE — Preoperative (Signed)
Beta Blockers   Reason not to administer Beta Blockers:Not Applicable 

## 2014-01-20 NOTE — Discharge Instructions (Signed)
No sports, running, or lifting more than 15 pounds for one month.  You may drive a car in 6-7 days.  You may shower, but do not take any tub bath.  Take a laxative or stool softener every day to prevent constipation  Drink lots of fluids  Walk as much as possible in the house and outside around your neighborhood.      [facility] _______[facility]  UMBILICAL OR INGUINAL HERNIA REPAIR: POST OP INSTRUCTIONS  Always review your discharge instruction sheet given to you by the facility where your surgery was performed. IF YOU HAVE DISABILITY OR FAMILY LEAVE FORMS, YOU MUST BRING THEM TO THE OFFICE FOR PROCESSING.   DO NOT GIVE THEM TO YOUR DOCTOR.  1. A  prescription for pain medication may be given to you upon discharge.  Take your pain medication as prescribed, if needed.  If narcotic pain medicine is not needed, then you may take acetaminophen (Tylenol) or ibuprofen (Advil) as needed. 2. Take your usually prescribed medications unless otherwise directed. 3. If you need a refill on your pain medication, please contact your pharmacy.  They will contact our office to request authorization. Prescriptions will not be filled after 5 pm or on week-ends. 4. You should follow a light diet the first 24 hours after arrival home, such as soup and crackers, etc.  Be sure to include lots of fluids daily.  Resume your normal diet the day after surgery. 5. Most patients will experience some swelling and bruising around the umbilicus or in the groin and scrotum.  Ice packs and reclining will help.  Swelling and bruising can take several days to resolve.  6. It is common to experience some constipation if taking pain medication after surgery.  Increasing fluid intake and taking a stool softener (such as Colace) will usually help or prevent this problem from occurring.  A mild laxative (Milk of Magnesia or Miralax) should be taken according to package directions if there are no bowel movements  after 48 hours. 7. Unless discharge instructions indicate otherwise, you may remove your bandages 24-48 hours after surgery, and you may shower at that time.  You may have steri-strips (small skin tapes) in place directly over the incision.  These strips should be left on the skin for 7-10 days.  If your surgeon used skin glue on the incision, you may shower in 24 hours.  The glue will flake off over the next 2-3 weeks.  Any sutures or staples will be removed at the office during your follow-up visit. 8. ACTIVITIES:  You may resume regular (light) daily activities beginning the next day--such as daily self-care, walking, climbing stairs--gradually increasing activities as tolerated.  You may have sexual intercourse when it is comfortable.  Refrain from any heavy lifting or straining until approved by your doctor. a. You may drive when you are no longer taking prescription pain medication, you can comfortably wear a seatbelt, and you can safely maneuver your car and apply brakes. b. RETURN TO WORK:  __________________________________________________________ 9. You should see your doctor in the office for a follow-up appointment approximately 2-3 weeks after your surgery.  Make sure that you call for this appointment within a day or two after you arrive home to insure a convenient appointment time. 10. OTHER INSTRUCTIONS:  __________________________________________________________________________________________________________________________________________________________________________________________  WHEN TO CALL YOUR DOCTOR: 1. Fever over 101.0 2. Inability to urinate 3. Nausea and/or vomiting 4. Extreme swelling or bruising 5. Continued bleeding from incision. 6. Increased pain, redness, or  drainage from the incision  The clinic staff is available to answer your questions during regular business hours.  Please dont hesitate to call and ask to speak to one of the nurses for clinical concerns.   If you have a medical emergency, go to the nearest emergency room or call 911.  A surgeon from [facility] is always on call at the hospital   [address] [place], [place], [place]  [address] ?  [address], [place], [place]   [address] [phone] ? [phone] ? FAX [phone] Web site: [email or web address]

## 2014-01-23 ENCOUNTER — Encounter (HOSPITAL_COMMUNITY): Payer: Self-pay | Admitting: General Surgery

## 2014-02-17 ENCOUNTER — Ambulatory Visit (INDEPENDENT_AMBULATORY_CARE_PROVIDER_SITE_OTHER): Payer: Medicare Other | Admitting: General Surgery

## 2014-02-17 ENCOUNTER — Encounter (INDEPENDENT_AMBULATORY_CARE_PROVIDER_SITE_OTHER): Payer: Self-pay | Admitting: General Surgery

## 2014-02-17 ENCOUNTER — Encounter (INDEPENDENT_AMBULATORY_CARE_PROVIDER_SITE_OTHER): Payer: Self-pay

## 2014-02-17 VITALS — BP 118/80 | HR 72 | Temp 97.4°F | Resp 14 | Ht 65.0 in | Wt 164.8 lb

## 2014-02-17 DIAGNOSIS — K4090 Unilateral inguinal hernia, without obstruction or gangrene, not specified as recurrent: Secondary | ICD-10-CM

## 2014-02-17 NOTE — Patient Instructions (Signed)
You are recovering from your left inguinal hernia surgery without any obvious problems.  You may return to work and exercise without any restriction on March 2.  Be sure to stretch well before and after work or exercise  Return to see Dr. Dalbert Batman as needed.

## 2014-02-17 NOTE — Progress Notes (Signed)
Patient ID: Brian Allen., male   DOB: [birth year], 69 y.o.   MRN: [id number] History: This patient underwent open repair left inguinal hernia with mesh on 01/20/2014. He has recovered uneventfully. He has minimal pain. No wound problems  Exam: Patient looks well. No distress. Wife is with him Left groin incision is healing normally. Almost no swelling. No tenderness. Hernia repair is intact. No fluid collection. Penis scrotum and testes normal  Assessment: Left inguinal hernia, recovering uneventfully following open repair with mesh  Plan: Return to work and exercise without restriction March 2 Return to see me as necessary     Edsel Petrin. Dalbert Batman, M.D., [facility] General and Minimally invasive Surgery Breast and Colorectal Surgery Office:   [phone] Pager:   [phone]

## 2014-04-10 ENCOUNTER — Other Ambulatory Visit: Payer: Self-pay | Admitting: Internal Medicine

## 2014-04-12 ENCOUNTER — Other Ambulatory Visit: Payer: Self-pay

## 2014-04-12 MED ORDER — LOVASTATIN 40 MG PO TABS: 40.0000 mg | ORAL_TABLET | Freq: Every day | ORAL | Status: DC

## 2014-04-14 DIAGNOSIS — C61 Malignant neoplasm of prostate: Secondary | ICD-10-CM | POA: Diagnosis not present

## 2014-04-19 ENCOUNTER — Telehealth: Payer: Self-pay | Admitting: Internal Medicine

## 2014-04-19 ENCOUNTER — Ambulatory Visit (INDEPENDENT_AMBULATORY_CARE_PROVIDER_SITE_OTHER): Payer: Medicare Other | Admitting: Internal Medicine

## 2014-04-19 ENCOUNTER — Encounter: Payer: Self-pay | Admitting: Internal Medicine

## 2014-04-19 VITALS — BP 150/90 | HR 66 | Temp 97.9°F | Resp 22 | Ht 65.0 in | Wt 167.0 lb

## 2014-04-19 DIAGNOSIS — I10 Essential (primary) hypertension: Secondary | ICD-10-CM | POA: Diagnosis not present

## 2014-04-19 DIAGNOSIS — M545 Low back pain, unspecified: Secondary | ICD-10-CM

## 2014-04-19 DIAGNOSIS — M1710 Unilateral primary osteoarthritis, unspecified knee: Secondary | ICD-10-CM

## 2014-04-19 DIAGNOSIS — IMO0002 Reserved for concepts with insufficient information to code with codable children: Secondary | ICD-10-CM

## 2014-04-19 DIAGNOSIS — E785 Hyperlipidemia, unspecified: Secondary | ICD-10-CM | POA: Diagnosis not present

## 2014-04-19 DIAGNOSIS — Z23 Encounter for immunization: Secondary | ICD-10-CM

## 2014-04-19 DIAGNOSIS — M1712 Unilateral primary osteoarthritis, left knee: Secondary | ICD-10-CM | POA: Insufficient documentation

## 2014-04-19 MED ORDER — PNEUMOCOCCAL 13-VAL CONJ VACC IM SUSP: 0.5000 mL | Freq: Once | INTRAMUSCULAR | Status: DC

## 2014-04-19 MED ORDER — TIZANIDINE HCL 4 MG PO TABS: 4.0000 mg | ORAL_TABLET | Freq: Four times a day (QID) | ORAL | Status: DC | PRN

## 2014-04-19 MED ORDER — MELOXICAM 7.5 MG PO TABS: ORAL_TABLET | ORAL | Status: DC

## 2014-04-19 NOTE — Assessment & Plan Note (Signed)
S/p surgury, for mobic prn,  to f/u any worsening symptoms or concerns

## 2014-04-19 NOTE — Patient Instructions (Addendum)
You had the new Prevnar pneumonia shot today  Please take all new medication as prescribed - the meloxicam as needed for pain, and zanaflex (generic) for muscle relaxer if this helps  You can also take tylenol 8 hr (OTC) as needed for pain as well  Please continue all other medications as before, and refills have been done if requested. Please have the pharmacy call with any other refills you may need.   Please continue your efforts at being more active, low cholesterol diet, and weight control.  You are otherwise up to date with prevention measures today.  Please keep your appointments with your specialists as you have planned - urology next wk  We can hold on further lab work today  Please check your Blood Pressure on a regular basis; your goal is to be < 140/90  Please return in 3 months, or sooner if needed

## 2014-04-19 NOTE — Telephone Encounter (Signed)
Relevant patient education mailed to patient.  

## 2014-04-19 NOTE — Assessment & Plan Note (Signed)
D/w pt, stable overall by history and exam, recent data reviewed with pt, and pt to continue medical treatment as before,  to f/u any worsening symptoms or concerns  Declines labs today

## 2014-04-19 NOTE — Assessment & Plan Note (Signed)
Mild elev today, has been lower previous, will ask pt to check BP at home, f/u in 3 months, cont same tx for now BP Readings from Last 3 Encounters:  04/19/14 150/90  02/17/14 118/80  01/20/14 138/76

## 2014-04-19 NOTE — Telephone Encounter (Signed)
Relevant patient education assigned to patient using Emmi. ° °

## 2014-04-19 NOTE — Assessment & Plan Note (Signed)
Most liekly c/w deg change pain (DJD, DDD) , exam without change, - for mobic prn,  to f/u any worsening symptoms or concerns

## 2014-04-19 NOTE — Progress Notes (Signed)
Pre visit review using our clinic review tool, if applicable. No additional management support is needed unless otherwise documented below in the visit note. 
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
## 2014-04-21 DIAGNOSIS — N529 Male erectile dysfunction, unspecified: Secondary | ICD-10-CM | POA: Diagnosis not present

## 2014-04-21 DIAGNOSIS — C61 Malignant neoplasm of prostate: Secondary | ICD-10-CM | POA: Diagnosis not present

## 2014-04-21 DIAGNOSIS — N318 Other neuromuscular dysfunction of bladder: Secondary | ICD-10-CM | POA: Diagnosis not present

## 2014-05-03 ENCOUNTER — Encounter: Payer: Self-pay | Admitting: Family Medicine

## 2014-05-03 ENCOUNTER — Ambulatory Visit (INDEPENDENT_AMBULATORY_CARE_PROVIDER_SITE_OTHER): Payer: Medicare Other | Admitting: Family Medicine

## 2014-05-03 ENCOUNTER — Other Ambulatory Visit (INDEPENDENT_AMBULATORY_CARE_PROVIDER_SITE_OTHER): Payer: Medicare Other

## 2014-05-03 VITALS — BP 120/78 | HR 94 | Wt 158.0 lb

## 2014-05-03 DIAGNOSIS — IMO0002 Reserved for concepts with insufficient information to code with codable children: Secondary | ICD-10-CM | POA: Diagnosis not present

## 2014-05-03 DIAGNOSIS — M25519 Pain in unspecified shoulder: Secondary | ICD-10-CM

## 2014-05-03 DIAGNOSIS — M75100 Unspecified rotator cuff tear or rupture of unspecified shoulder, not specified as traumatic: Secondary | ICD-10-CM | POA: Diagnosis not present

## 2014-05-03 DIAGNOSIS — M25511 Pain in right shoulder: Secondary | ICD-10-CM

## 2014-05-03 DIAGNOSIS — M7550 Bursitis of unspecified shoulder: Secondary | ICD-10-CM | POA: Insufficient documentation

## 2014-05-03 NOTE — Progress Notes (Signed)
[facility] [name], [place] [address] Phone: [phone] Subjective:    I'm seeing this patient by the request  of:  Cathlean Cower, MD   CC: Right arm pain  EPP:IRJJOACZYS Brian Allen. is a 68 y.o. male coming in with complaint of right arm pain. Patient states that this occurred over the last 48 hours. Patient did have a small fall and did notice some mild increased in pain and mild weakness. Patient was having some pain intermittently before this. Patient denies any neck pain that is associated with this. Patient states though that he cannot find any comfortable way and has a severe sharp pain with certain motions. Patient denies any radiation down the arm or any numbness. Patient has tried over-the-counter medications without any significant improvement. Patient with the severity of 9/10. Patient states the pain is constant and once again denies any injury.     Past medical history, social, surgical and family history all reviewed in electronic medical record.   Review of Systems: No headache, visual changes, nausea, vomiting, diarrhea, constipation, dizziness, abdominal pain, skin rash, fevers, chills, night sweats, weight loss, swollen lymph nodes, body aches, joint swelling, muscle aches, chest pain, shortness of breath, mood changes.   Objective Blood pressure 120/78, pulse 94, weight 158 lb (71.668 kg), SpO2 91.00%.  General: No apparent distress alert and oriented x3 mood and affect normal, dressed appropriately.  HEENT: Pupils equal, extraocular movements intact  Respiratory: Patient's speak in full sentences and does not appear short of breath  Cardiovascular: No lower extremity edema, non tender, no erythema  Skin: Warm dry intact with no signs of infection or rash on extremities or on axial skeleton.  Abdomen: Soft nontender  Neuro: Cranial nerves II through XII are intact, neurovascularly intact in all extremities with 2+ DTRs  and 2+ pulses.  Lymph: No lymphadenopathy of posterior or anterior cervical chain or axillae bilaterally.  Gait normal with good balance and coordination.  MSK:  Non tender with full range of motion and good stability and symmetric strength and tone of  elbows, wrist, hip, knees and ankles bilaterally. Patient does have osteoarthritic changes of multiple joints. Shoulder: Right Inspection reveals no abnormalities, atrophy or asymmetry. Palpation is normal with no tenderness over AC joint or bicipital groove. ROM is full in all planes passively. Rotator cuff strength normal throughout. signs of impingement with positive Neer and Hawkin's tests, but negative empty can sign. Speeds and Yergason's tests normal. No labral pathology noted with negative Obrien's, negative clunk and good stability. Normal scapular function observed. No painful arc and no drop arm sign. No apprehension sign  MSK US performed of: Right This study was ordered, performed, and interpreted by Charlann Boxer D.O.  Shoulder:   Supraspinatus:  Appears normal on long and transverse views, Bursal bulge seen with shoulder abduction on impingement view. Infraspinatus:  Appears normal on long and transverse views. Significant increase in Doppler flow Subscapularis:  Appears normal on long and transverse views. Positive bursa Teres Minor:  Appears normal on long and transverse views. AC joint:  Capsule undistended, no geyser sign. Glenohumeral Joint:  Appears normal without effusion. Glenoid Labrum:  Intact without visualized tears. Biceps Tendon:  Appears normal on long and transverse views, no fraying of tendon, tendon located in intertubercular groove, no subluxation with shoulder internal or external rotation.  Impression: Subacromial bursitis  Procedure: Real-time Ultrasound Guided Injection of right glenohumeral joint Device: GE Logiq E  Ultrasound guided injection  is preferred based studies that show increased duration,  increased effect, greater accuracy, decreased procedural pain, increased response rate with ultrasound guided versus blind injection.  Verbal informed consent obtained.  Time-out conducted.  Noted no overlying erythema, induration, or other signs of local infection.  Skin prepped in a sterile fashion.  Local anesthesia: Topical Ethyl chloride.  With sterile technique and under real time ultrasound guidance:  Joint visualized.  23g 1  inch needle inserted posterior approach. Pictures taken for needle placement. Patient did have injection of 2 cc of 1% lidocaine, 2 cc of 0.5% Marcaine, and 1.0 cc of Kenalog 40 mg/dL. Completed without difficulty  Pain immediately resolved suggesting accurate placement of the medication.  Advised to call if fevers/chills, erythema, induration, drainage, or persistent bleeding.  Images permanently stored and available for review in the ultrasound unit.  Impression: Technically successful ultrasound guided injection.      Impression and Recommendations:     This case required medical decision making of moderate complexity.

## 2014-05-03 NOTE — Assessment & Plan Note (Signed)
Patient's right shoulder appears to be secondary to subacromial bursitis. Patient also has a history of CPPD and does have some mild findings that is consistent with this. We discussed with patient that we may need to change medication such as the hydrochlorothiazide and we will discuss with primary care provider. Patient did have complete resolution after the injections are likely will do fine. Patient given home exercises and icing protocol. Patient will try these interventions and come back in 3 weeks for further evaluation.

## 2014-05-03 NOTE — Patient Instructions (Signed)
Very nice to meet you Ice 20 minutes 2 times a day can help can help exercises 3 times  A week Good luck on your biopsy See me again in 3 weeks if not perfect

## 2014-05-16 ENCOUNTER — Other Ambulatory Visit: Payer: Self-pay | Admitting: Internal Medicine

## 2014-05-19 DIAGNOSIS — C61 Malignant neoplasm of prostate: Secondary | ICD-10-CM | POA: Diagnosis not present

## 2014-05-19 DIAGNOSIS — R972 Elevated prostate specific antigen [PSA]: Secondary | ICD-10-CM | POA: Diagnosis not present

## 2014-05-24 ENCOUNTER — Ambulatory Visit: Payer: Medicare Other | Admitting: Family Medicine

## 2014-05-29 DIAGNOSIS — C61 Malignant neoplasm of prostate: Secondary | ICD-10-CM | POA: Diagnosis not present

## 2014-06-19 ENCOUNTER — Other Ambulatory Visit: Payer: Self-pay | Admitting: Internal Medicine

## 2014-07-20 ENCOUNTER — Other Ambulatory Visit (INDEPENDENT_AMBULATORY_CARE_PROVIDER_SITE_OTHER): Payer: Medicare Other

## 2014-07-20 ENCOUNTER — Ambulatory Visit (INDEPENDENT_AMBULATORY_CARE_PROVIDER_SITE_OTHER): Payer: Medicare Other | Admitting: Internal Medicine

## 2014-07-20 ENCOUNTER — Encounter: Payer: Self-pay | Admitting: Internal Medicine

## 2014-07-20 ENCOUNTER — Ambulatory Visit (INDEPENDENT_AMBULATORY_CARE_PROVIDER_SITE_OTHER)
Admission: RE | Admit: 2014-07-20 | Discharge: 2014-07-20 | Disposition: A | Discharge status: Home / Self Care | Payer: Medicare Other | Source: Ambulatory Visit | Attending: Internal Medicine | Admitting: Internal Medicine

## 2014-07-20 VITALS — BP 118/84 | HR 72 | Temp 98.4°F | Ht 65.0 in | Wt 154.0 lb

## 2014-07-20 DIAGNOSIS — E785 Hyperlipidemia, unspecified: Secondary | ICD-10-CM

## 2014-07-20 DIAGNOSIS — R634 Abnormal weight loss: Secondary | ICD-10-CM | POA: Diagnosis not present

## 2014-07-20 DIAGNOSIS — K219 Gastro-esophageal reflux disease without esophagitis: Secondary | ICD-10-CM | POA: Diagnosis not present

## 2014-07-20 DIAGNOSIS — J438 Other emphysema: Secondary | ICD-10-CM | POA: Diagnosis not present

## 2014-07-20 DIAGNOSIS — F17200 Nicotine dependence, unspecified, uncomplicated: Secondary | ICD-10-CM

## 2014-07-20 DIAGNOSIS — I10 Essential (primary) hypertension: Secondary | ICD-10-CM

## 2014-07-20 DIAGNOSIS — Z0000 Encounter for general adult medical examination without abnormal findings: Secondary | ICD-10-CM

## 2014-07-20 LAB — BASIC METABOLIC PANEL
BUN: 15 mg/dL (ref 6–23)
CHLORIDE: 106 meq/L (ref 96–112)
CO2: 32 mEq/L (ref 19–32)
Calcium: 9.6 mg/dL (ref 8.4–10.5)
Creatinine, Ser: 1.1 mg/dL (ref 0.4–1.5)
GFR: 87.04 mL/min (ref >60.00)
Glucose, Bld: 88 mg/dL (ref 70–99)
POTASSIUM: 3.8 meq/L (ref 3.5–5.1)
Sodium: 141 mEq/L (ref 135–145)

## 2014-07-20 LAB — URINALYSIS, ROUTINE W REFLEX MICROSCOPIC
BILIRUBIN URINE: NEGATIVE
HGB URINE DIPSTICK: NEGATIVE
Ketones, ur: NEGATIVE
LEUKOCYTES UA: NEGATIVE
Nitrite: NEGATIVE
RBC / HPF: NONE SEEN (ref 0–?)
Specific Gravity, Urine: 1.02 (ref 1.000–1.030)
Total Protein, Urine: NEGATIVE
UROBILINOGEN UA: 0.2 (ref 0.0–1.0)
Urine Glucose: NEGATIVE
pH: 5.5 (ref 5.0–8.0)

## 2014-07-20 LAB — CBC WITH DIFFERENTIAL/PLATELET
Basophils Absolute: 0 10*3/uL (ref 0.0–0.1)
Basophils Relative: 0.5 % (ref 0.0–3.0)
Eosinophils Absolute: 0.2 10*3/uL (ref 0.0–0.7)
Eosinophils Relative: 2.8 % (ref 0.0–5.0)
HCT: 40.1 % (ref 39.0–52.0)
Hemoglobin: 13.4 g/dL (ref 13.0–17.0)
Lymphocytes Relative: 30 % (ref 12.0–46.0)
Lymphs Abs: 1.8 10*3/uL (ref 0.7–4.0)
MCHC: 33.5 g/dL (ref 30.0–36.0)
MCV: 95.5 fl (ref 78.0–100.0)
MONO ABS: 0.6 10*3/uL (ref 0.1–1.0)
Monocytes Relative: 9.4 % (ref 3.0–12.0)
NEUTROS PCT: 57.3 % (ref 43.0–77.0)
Neutro Abs: 3.4 10*3/uL (ref 1.4–7.7)
PLATELETS: 313 10*3/uL (ref 150.0–400.0)
RBC: 4.2 Mil/uL — ABNORMAL LOW (ref 4.22–5.81)
RDW: 13.6 % (ref 11.5–15.5)
WBC: 5.9 10*3/uL (ref 4.0–10.5)

## 2014-07-20 LAB — HEPATIC FUNCTION PANEL
ALT: 21 U/L (ref 0–53)
AST: 26 U/L (ref 0–37)
Albumin: 4.3 g/dL (ref 3.5–5.2)
Alkaline Phosphatase: 58 U/L (ref 39–117)
BILIRUBIN DIRECT: 0.1 mg/dL (ref 0.0–0.3)
BILIRUBIN TOTAL: 1 mg/dL (ref 0.2–1.2)
Total Protein: 7.4 g/dL (ref 6.0–8.3)

## 2014-07-20 LAB — TSH: TSH: 0.99 u[IU]/mL (ref 0.35–4.50)

## 2014-07-20 MED ORDER — HYDROCHLOROTHIAZIDE 25 MG PO TABS: 25.0000 mg | ORAL_TABLET | Freq: Every day | ORAL | Status: DC

## 2014-07-20 MED ORDER — TIZANIDINE HCL 4 MG PO TABS: 4.0000 mg | ORAL_TABLET | Freq: Four times a day (QID) | ORAL | Status: DC | PRN

## 2014-07-20 MED ORDER — LOVASTATIN 40 MG PO TABS: 40.0000 mg | ORAL_TABLET | Freq: Every day | ORAL | Status: DC

## 2014-07-20 MED ORDER — MELOXICAM 7.5 MG PO TABS: ORAL_TABLET | ORAL | Status: DC

## 2014-07-20 NOTE — Progress Notes (Signed)
Patient here for annual wellness visit, patient reports: Risk Factors/Conditions needing evaluation or treatment: Pt. Does not have any new risk factors that need evaluation.  Home Safety: Pt lives at home, with his wife and grandson in a one story home.  Pt reports two  smoke alarms and no adaptive equipment.  Other Information: Corrective lens: Pt  Does not wear corrective lens, has not had an eye exams in two years. Unable to perform eye exam in office due to patient's inability to read.  We referred the patient back to his opthamologist (Dr. Lanell Matar).  Dentures: Pt does not have dentures, has not had a  dental exam in years.  The patient was referred to [facility].   Memory: Pt feels he does not have any memory problems; however he states that his wife expresses to him that she believes he may have memory problems.   Patient's Mini Mental Score (recorded in doc. flowsheet): 19 Bladder:  Pt does not have problems with bladder control.  BMI/Exercise: We discussed BMI and strategies for weight maintenance including his diet and limiting fried foods.  We also discussed a regular exercise routine. Verbalized that his job is very physical and is "all the exercise I need." Med Adherence:  We discussed importance of taking medications for htn/hyperlipidemia.  Pt reports missing no days in the past week.  ADL/IADL:  Pt reports ability to perform all ADL.  States his wife does the laundry and grocery shopping.  Balance/Gait: Pt reports No falls in the past year.  We discussed home safety and fall prevention.       Annual Wellness Visit Requirements Recorded Today In  Medical, family, social history Past Medical, Family, Social History Section  Current providers Care team  Current medications Medications  Wt, BP, Ht, BMI Vital signs  Visual acuity (welcome visit) n/a  Hearing assessment (welcome visit) n/a  Tobacco, alcohol, illicit drug use History  ADL Nurse Assessment   Depression Screening Nurse Assessment  Cognitive impairment Document Flowsheet  Mini Mental Status Document Flowsheet  Fall Risk Fall/Depression  Home Safety Progress Note  End of Life Planning (welcome visit) Social Documentation  Medicare preventative services Health Maintenance  Risk factors/conditions needing evaluation/treatment Progress Note  Personalized health advice Patient Instructions, goals, letter  Diet & Exercise See Documentation above  Emergency Contact Social Documentation  Seat Belts Social Documentation  Sun exposure/protection None

## 2014-07-20 NOTE — Progress Notes (Signed)
I have reviewed this visit and discussed with Josh and agree with his documentation.
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
## 2014-07-20 NOTE — Progress Notes (Signed)
Pre visit review using our clinic review tool, if applicable. No additional management support is needed unless otherwise documented below in the visit note. 

## 2014-07-20 NOTE — Patient Instructions (Signed)
Please continue all other medications as before, and refills have been done if requested.  Please have the pharmacy call with any other refills you may need.  Please continue your efforts at being more active, low cholesterol diet, and weight control.  You are otherwise up to date with prevention measures today.  Please keep your appointments with your specialists as you may have planned  Please go to the XRAY Department in the Basement (go straight as you get off the elevator) for the x-ray testing  Please go to the LAB in the Basement (turn left off the elevator) for the tests to be done today  You will be contacted by phone if any changes need to be made immediately.  Otherwise, you will receive a letter about your results with an explanation, but please check with MyChart first.  Please remember to sign up for MyChart if you have not done so, as this will be important to you in the future with finding out test results, communicating by private email, and scheduling acute appointments online when needed.  Please return in 3 months, or sooner if needed

## 2014-07-22 ENCOUNTER — Emergency Department (HOSPITAL_COMMUNITY)
Admission: EM | Admit: 2014-07-22 | Discharge: 2014-07-22 | Disposition: A | Discharge status: Home / Self Care | Payer: Medicare Other | Attending: Emergency Medicine | Admitting: Emergency Medicine

## 2014-07-22 ENCOUNTER — Encounter (HOSPITAL_COMMUNITY): Payer: Self-pay | Admitting: Emergency Medicine

## 2014-07-22 DIAGNOSIS — Z79899 Other long term (current) drug therapy: Secondary | ICD-10-CM | POA: Diagnosis not present

## 2014-07-22 DIAGNOSIS — E785 Hyperlipidemia, unspecified: Secondary | ICD-10-CM | POA: Insufficient documentation

## 2014-07-22 DIAGNOSIS — Y9289 Other specified places as the place of occurrence of the external cause: Secondary | ICD-10-CM | POA: Insufficient documentation

## 2014-07-22 DIAGNOSIS — Z87828 Personal history of other (healed) physical injury and trauma: Secondary | ICD-10-CM | POA: Diagnosis not present

## 2014-07-22 DIAGNOSIS — I10 Essential (primary) hypertension: Secondary | ICD-10-CM | POA: Diagnosis not present

## 2014-07-22 DIAGNOSIS — T6391XA Toxic effect of contact with unspecified venomous animal, accidental (unintentional), initial encounter: Secondary | ICD-10-CM | POA: Diagnosis not present

## 2014-07-22 DIAGNOSIS — M129 Arthropathy, unspecified: Secondary | ICD-10-CM | POA: Insufficient documentation

## 2014-07-22 DIAGNOSIS — Z8546 Personal history of malignant neoplasm of prostate: Secondary | ICD-10-CM | POA: Diagnosis not present

## 2014-07-22 DIAGNOSIS — F17200 Nicotine dependence, unspecified, uncomplicated: Secondary | ICD-10-CM | POA: Insufficient documentation

## 2014-07-22 DIAGNOSIS — Z87448 Personal history of other diseases of urinary system: Secondary | ICD-10-CM | POA: Insufficient documentation

## 2014-07-22 DIAGNOSIS — Y9389 Activity, other specified: Secondary | ICD-10-CM | POA: Diagnosis not present

## 2014-07-22 DIAGNOSIS — T63461A Toxic effect of venom of wasps, accidental (unintentional), initial encounter: Secondary | ICD-10-CM | POA: Insufficient documentation

## 2014-07-22 DIAGNOSIS — T63444A Toxic effect of venom of bees, undetermined, initial encounter: Secondary | ICD-10-CM

## 2014-07-22 DIAGNOSIS — Z7982 Long term (current) use of aspirin: Secondary | ICD-10-CM | POA: Diagnosis not present

## 2014-07-22 MED ORDER — DIPHENHYDRAMINE HCL 25 MG PO CAPS
50.0000 mg | ORAL_CAPSULE | Freq: Once | ORAL | Status: AC
  Administered 2014-07-22: 50 mg via ORAL
  Filled 2014-07-22: qty 2

## 2014-07-22 MED ORDER — HYDROCODONE-ACETAMINOPHEN 5-325 MG PO TABS: ORAL_TABLET | ORAL | Status: DC

## 2014-07-22 MED ORDER — DIPHENHYDRAMINE HCL 25 MG PO TABS: 25.0000 mg | ORAL_TABLET | Freq: Four times a day (QID) | ORAL | Status: DC | PRN

## 2014-07-22 MED ORDER — FAMOTIDINE 20 MG PO TABS
20.0000 mg | ORAL_TABLET | Freq: Two times a day (BID) | ORAL | Status: DC
Start: 2014-07-22 — End: 2015-04-27

## 2014-07-22 MED ORDER — FAMOTIDINE 20 MG PO TABS
20.0000 mg | ORAL_TABLET | Freq: Once | ORAL | Status: AC
  Administered 2014-07-22: 20 mg via ORAL
  Filled 2014-07-22: qty 1

## 2014-07-22 MED ORDER — HYDROCODONE-ACETAMINOPHEN 5-325 MG PO TABS
1.0000 | ORAL_TABLET | Freq: Once | ORAL | Status: AC
  Administered 2014-07-22: 1 via ORAL
  Filled 2014-07-22: qty 1

## 2014-07-22 NOTE — Discharge Instructions (Signed)
Take vicodin for breakthrough pain, do not drink alcohol, drive, care for children or do other critical tasks while taking vicodin.  If you see signs of infection (warmth, redness, tenderness, pus, sharp increase in pain, fever, red streaking) immediately return to the emergency department.  Please follow with your primary care doctor in the next 2 days for a check-up. They must obtain records for further management.   Do not hesitate to return to the Emergency Department for any new, worsening or concerning symptoms.    Bee, Wasp, or Hornet Sting Your caregiver has diagnosed you as having an insect sting. An insect sting appears as a red lump in the skin that sometimes has a tiny hole in the center, or it may have a stinger in the center of the wound. The most common stings are from wasps, hornets and bees. Individuals have different reactions to insect stings.  A normal reaction may cause pain, swelling, and redness around the sting site.  A localized allergic reaction may cause swelling and redness that extends beyond the sting site.  A large local reaction may continue to develop over the next 12 to 36 hours.  On occasion, the reactions can be severe (anaphylactic reaction). An anaphylactic reaction may cause wheezing; difficulty breathing; chest pain; fainting; raised, itchy, red patches on the skin; a sick feeling to your stomach (nausea); vomiting; cramping; or diarrhea. If you have had an anaphylactic reaction to an insect sting in the past, you are more likely to have one again. HOME CARE INSTRUCTIONS   With bee stings, a small sac of poison is left in the wound. Brushing across this with something such as a credit card, or anything similar, will help remove this and decrease the amount of the reaction. This same procedure will not help a wasp sting as they do not leave behind a stinger and poison sac.  Apply a cold compress for 10 to 20 minutes every hour for 1 to 2 days, depending  on severity, to reduce swelling and itching.  To lessen pain, a paste made of water and baking soda may be rubbed on the bite or sting and left on for 5 minutes.  To relieve itching and swelling, you may use take medication or apply medicated creams or lotions as directed.  Only take over-the-counter or prescription medicines for pain, discomfort, or fever as directed by your caregiver.  Wash the sting site daily with soap and water. Apply antibiotic ointment on the sting site as directed.  If you suffered a severe reaction:  If you did not require hospitalization, an adult will need to stay with you for 24 hours in case the symptoms return.  You may need to wear a medical bracelet or necklace stating the allergy.  You and your family need to learn when and how to use an anaphylaxis kit or epinephrine injection.  If you have had a severe reaction before, always carry your anaphylaxis kit with you. SEEK MEDICAL CARE IF:   None of the above helps within 2 to 3 days.  The area becomes red, warm, tender, and swollen beyond the area of the bite or sting.  You have an oral temperature above 102 F (38.9 C). SEEK IMMEDIATE MEDICAL CARE IF:  You have symptoms of an allergic reaction which are:  Wheezing.  Difficulty breathing.  Chest pain.  Lightheadedness or fainting.  Itchy, raised, red patches on the skin.  Nausea, vomiting, cramping or diarrhea. ANY OF THESE SYMPTOMS MAY REPRESENT A  SERIOUS PROBLEM THAT IS AN EMERGENCY. Do not wait to see if the symptoms will go away. Get medical help right away. Call your local emergency services (911 in [place] DO NOT drive yourself to the hospital. MAKE SURE YOU:   Understand these instructions.  Will watch your condition.  Will get help right away if you are not doing well or get worse. Document Released: 12/15/2005 Document Revised: 03/08/2012 Document Reviewed: 06/01/2010 [facility] Patient Information 2015 [place], [place]. This  information is not intended to replace advice given to you by your health care provider. Make sure you discuss any questions you have with your health care provider.
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
## 2014-07-22 NOTE — ED Notes (Signed)
Pt A+OX4, reports was mowing the yard this AM and was stung multiple times by bees.  Pt reports stings to bilat hands, tip of nose and behind L ear.  Bilat hands swollen, red.  Pt denies SOB, denies swelling elsewhere, denies difficulty breathing or swallowing.  Skin otherwise PWD.  MAEI.  Speaking full/clear sentences, rr even/un-lab.  NAD.

## 2014-07-23 NOTE — Assessment & Plan Note (Signed)
Urged to quit, declines chantix 

## 2014-07-23 NOTE — Assessment & Plan Note (Signed)
stable overall by history and exam, recent data reviewed with pt, and pt to continue medical treatment as before,  to f/u any worsening symptoms or concerns Lab Results  Component Value Date   LDLCALC 82 03/24/2013

## 2014-07-23 NOTE — Assessment & Plan Note (Signed)
stable overall by history and exam, and pt to continue medical treatment as before,  to f/u any worsening symptoms or concerns 

## 2014-07-23 NOTE — Assessment & Plan Note (Signed)
stable overall by history and exam, recent data reviewed with pt, and pt to continue medical treatment as before,  to f/u any worsening symptoms or concerns BP Readings from Last 3 Encounters:  07/22/14 114/90  07/20/14 118/84  07/20/14 118/84

## 2014-07-23 NOTE — ED Provider Notes (Signed)
Medical screening examination/treatment/procedure(s) were performed by non-physician practitioner and as supervising physician I was immediately available for consultation/collaboration.   Dorie Rank, MD 07/23/14 0800

## 2014-07-23 NOTE — Assessment & Plan Note (Signed)
Unclear etiology, for cxr, lab today,  to f/u any worsening symptoms or concerns

## 2014-07-24 LAB — PROTEIN ELECTROPHORESIS, SERUM
ALPHA-2-GLOBULIN: 10.9 % (ref 7.1–11.8)
Albumin ELP: 58.9 % (ref 55.8–66.1)
Alpha-1-Globulin: 5.7 % — ABNORMAL HIGH (ref 2.9–4.9)
BETA GLOBULIN: 6.4 % (ref 4.7–7.2)
Beta 2: 5.6 % (ref 3.2–6.5)
GAMMA GLOBULIN: 12.5 % (ref 11.1–18.8)
Total Protein, Serum Electrophoresis: 7 g/dL (ref 6.0–8.3)

## 2014-07-25 ENCOUNTER — Encounter: Payer: Self-pay | Admitting: Internal Medicine

## 2014-09-14 DIAGNOSIS — H4011X0 Primary open-angle glaucoma, stage unspecified: Secondary | ICD-10-CM | POA: Diagnosis not present

## 2014-09-25 ENCOUNTER — Encounter: Payer: Self-pay | Admitting: Gastroenterology

## 2014-10-17 DIAGNOSIS — H4011X3 Primary open-angle glaucoma, severe stage: Secondary | ICD-10-CM | POA: Diagnosis not present

## 2014-10-19 ENCOUNTER — Ambulatory Visit (INDEPENDENT_AMBULATORY_CARE_PROVIDER_SITE_OTHER): Payer: Medicare Other | Admitting: Internal Medicine

## 2014-10-19 ENCOUNTER — Encounter: Payer: Self-pay | Admitting: Internal Medicine

## 2014-10-19 VITALS — BP 140/90 | HR 81 | Temp 98.0°F | Wt 159.1 lb

## 2014-10-19 DIAGNOSIS — E785 Hyperlipidemia, unspecified: Secondary | ICD-10-CM | POA: Diagnosis not present

## 2014-10-19 DIAGNOSIS — I10 Essential (primary) hypertension: Secondary | ICD-10-CM

## 2014-10-19 DIAGNOSIS — Z23 Encounter for immunization: Secondary | ICD-10-CM | POA: Diagnosis not present

## 2014-10-19 MED ORDER — LOVASTATIN 40 MG PO TABS: 40.0000 mg | ORAL_TABLET | Freq: Every day | ORAL | Status: DC

## 2014-10-19 MED ORDER — TIZANIDINE HCL 4 MG PO TABS: 4.0000 mg | ORAL_TABLET | Freq: Four times a day (QID) | ORAL | Status: DC | PRN

## 2014-10-19 MED ORDER — LISINOPRIL 20 MG PO TABS: 20.0000 mg | ORAL_TABLET | Freq: Every morning | ORAL | Status: DC

## 2014-10-19 MED ORDER — HYDROCHLOROTHIAZIDE 25 MG PO TABS: 25.0000 mg | ORAL_TABLET | Freq: Every day | ORAL | Status: DC
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
## 2014-10-19 NOTE — Patient Instructions (Signed)
Please continue to not smoke anymore as you are doing  Please continue all other medications as before,   Please have the pharmacy call with any other refills you may need.  Please continue your efforts at being more active, low cholesterol diet, and weight control.  Please keep your appointments with your specialists as you may have planned  Please return in 6 months, or sooner if needed

## 2014-10-19 NOTE — Progress Notes (Signed)
Pre visit review using our clinic review tool, if applicable. No additional management support is needed unless otherwise documented below in the visit note. 

## 2014-10-19 NOTE — Assessment & Plan Note (Signed)
stable overall by history and exam, recent data reviewed with pt, and pt to continue medical treatment as before,  to f/u any worsening symptoms or concerns Lab Results  Component Value Date   LDLCALC 82 03/24/2013

## 2014-10-19 NOTE — Assessment & Plan Note (Signed)
stable overall by history and exam, recent data reviewed with pt, and pt to continue medical treatment as before,  to f/u any worsening symptoms or concerns BP Readings from Last 3 Encounters:  10/19/14 140/90  07/22/14 114/90  07/20/14 118/84

## 2014-11-21 DIAGNOSIS — C61 Malignant neoplasm of prostate: Secondary | ICD-10-CM | POA: Diagnosis not present

## 2014-11-24 ENCOUNTER — Telehealth: Payer: Self-pay

## 2014-11-24 NOTE — Telephone Encounter (Signed)
[facility] called and stated that the pt and the pharmacy were confused about what the pt needed to be taking RE: lovastatin.

## 2014-11-26 NOTE — Telephone Encounter (Signed)
Ok for lovastatin 40 qd

## 2014-12-13 ENCOUNTER — Observation Stay (HOSPITAL_COMMUNITY)
Admission: EM | Admit: 2014-12-13 | Discharge: 2014-12-15 | Disposition: A | Discharge status: Home / Self Care | Payer: Medicare Other | Attending: Internal Medicine | Admitting: Internal Medicine

## 2014-12-13 ENCOUNTER — Encounter (HOSPITAL_COMMUNITY): Payer: Self-pay

## 2014-12-13 DIAGNOSIS — M5080 Other cervical disc disorders, unspecified cervical region: Secondary | ICD-10-CM | POA: Insufficient documentation

## 2014-12-13 DIAGNOSIS — I440 Atrioventricular block, first degree: Secondary | ICD-10-CM | POA: Diagnosis not present

## 2014-12-13 DIAGNOSIS — E785 Hyperlipidemia, unspecified: Secondary | ICD-10-CM | POA: Insufficient documentation

## 2014-12-13 DIAGNOSIS — Z888 Allergy status to other drugs, medicaments and biological substances status: Secondary | ICD-10-CM | POA: Insufficient documentation

## 2014-12-13 DIAGNOSIS — D649 Anemia, unspecified: Secondary | ICD-10-CM | POA: Diagnosis not present

## 2014-12-13 DIAGNOSIS — M1389 Other specified arthritis, multiple sites: Secondary | ICD-10-CM | POA: Insufficient documentation

## 2014-12-13 DIAGNOSIS — E860 Dehydration: Secondary | ICD-10-CM | POA: Diagnosis not present

## 2014-12-13 DIAGNOSIS — I10 Essential (primary) hypertension: Secondary | ICD-10-CM | POA: Diagnosis not present

## 2014-12-13 DIAGNOSIS — I959 Hypotension, unspecified: Secondary | ICD-10-CM | POA: Insufficient documentation

## 2014-12-13 DIAGNOSIS — N529 Male erectile dysfunction, unspecified: Secondary | ICD-10-CM | POA: Diagnosis not present

## 2014-12-13 DIAGNOSIS — Z8546 Personal history of malignant neoplasm of prostate: Secondary | ICD-10-CM | POA: Diagnosis not present

## 2014-12-13 DIAGNOSIS — Z87891 Personal history of nicotine dependence: Secondary | ICD-10-CM | POA: Insufficient documentation

## 2014-12-13 DIAGNOSIS — R42 Dizziness and giddiness: Secondary | ICD-10-CM | POA: Diagnosis not present

## 2014-12-13 DIAGNOSIS — E876 Hypokalemia: Secondary | ICD-10-CM | POA: Diagnosis not present

## 2014-12-13 DIAGNOSIS — R001 Bradycardia, unspecified: Secondary | ICD-10-CM | POA: Diagnosis not present

## 2014-12-13 DIAGNOSIS — D72819 Decreased white blood cell count, unspecified: Secondary | ICD-10-CM | POA: Diagnosis not present

## 2014-12-13 DIAGNOSIS — I951 Orthostatic hypotension: Secondary | ICD-10-CM | POA: Diagnosis present

## 2014-12-13 DIAGNOSIS — Z7982 Long term (current) use of aspirin: Secondary | ICD-10-CM | POA: Diagnosis not present

## 2014-12-13 LAB — CBC WITH DIFFERENTIAL/PLATELET
Basophils Absolute: 0 10*3/uL (ref 0.0–0.1)
Basophils Relative: 1 % (ref 0–1)
EOS PCT: 6 % — AB (ref 0–5)
Eosinophils Absolute: 0.2 10*3/uL (ref 0.0–0.7)
HEMATOCRIT: 31 % — AB (ref 39.0–52.0)
HEMOGLOBIN: 10.8 g/dL — AB (ref 13.0–17.0)
LYMPHS ABS: 1.3 10*3/uL (ref 0.7–4.0)
Lymphocytes Relative: 37 % (ref 12–46)
MCH: 31.9 pg (ref 26.0–34.0)
MCHC: 34.8 g/dL (ref 30.0–36.0)
MCV: 91.4 fL (ref 78.0–100.0)
MONOS PCT: 12 % (ref 3–12)
Monocytes Absolute: 0.4 10*3/uL (ref 0.1–1.0)
Neutro Abs: 1.6 10*3/uL — ABNORMAL LOW (ref 1.7–7.7)
Neutrophils Relative %: 44 % (ref 43–77)
Platelets: 255 10*3/uL (ref 150–400)
RBC: 3.39 MIL/uL — AB (ref 4.22–5.81)
RDW: 13.2 % (ref 11.5–15.5)
WBC: 3.6 10*3/uL — ABNORMAL LOW (ref 4.0–10.5)

## 2014-12-13 LAB — BASIC METABOLIC PANEL
Anion gap: 12 (ref 5–15)
BUN: 12 mg/dL (ref 6–23)
CALCIUM: 8.4 mg/dL (ref 8.4–10.5)
CO2: 23 mEq/L (ref 19–32)
Chloride: 109 mEq/L (ref 96–112)
Creatinine, Ser: 0.88 mg/dL (ref 0.50–1.35)
GFR calc Af Amer: >90 mL/min (ref >90)
GFR, EST NON AFRICAN AMERICAN: 86 mL/min — AB (ref >90)
GLUCOSE: 150 mg/dL — AB (ref 70–99)
POTASSIUM: 3.5 meq/L — AB (ref 3.7–5.3)
Sodium: 144 mEq/L (ref 137–147)

## 2014-12-13 LAB — RAPID URINE DRUG SCREEN, HOSP PERFORMED
Amphetamines: NOT DETECTED
BARBITURATES: NOT DETECTED
BENZODIAZEPINES: NOT DETECTED
Cocaine: NOT DETECTED
Opiates: NOT DETECTED
TETRAHYDROCANNABINOL: NOT DETECTED

## 2014-12-13 LAB — ETHANOL: Alcohol, Ethyl (B): 14 mg/dL — ABNORMAL HIGH (ref 0–11)

## 2014-12-13 LAB — I-STAT CG4 LACTIC ACID, ED: LACTIC ACID, VENOUS: 1.49 mmol/L (ref 0.5–2.2)

## 2014-12-13 LAB — TROPONIN I: Troponin I: <0.3 ng/mL (ref <0.30)

## 2014-12-13 MED ORDER — SODIUM CHLORIDE 0.9 % IJ SOLN: 3.0000 mL | Freq: Two times a day (BID) | INTRAMUSCULAR | Status: DC

## 2014-12-13 MED ORDER — ZOLPIDEM TARTRATE 5 MG PO TABS
5.0000 mg | ORAL_TABLET | Freq: Once | ORAL | Status: AC
  Administered 2014-12-13: 5 mg via ORAL
  Filled 2014-12-13: qty 1

## 2014-12-13 MED ORDER — SODIUM CHLORIDE 0.9 % IV SOLN
INTRAVENOUS | Status: AC
  Administered 2014-12-13: 75 mL/h via INTRAVENOUS

## 2014-12-13 MED ORDER — SODIUM CHLORIDE 0.9 % IV SOLN
INTRAVENOUS | Status: DC
  Administered 2014-12-13: 12:00:00 via INTRAVENOUS

## 2014-12-13 MED ORDER — POTASSIUM CHLORIDE CRYS ER 20 MEQ PO TBCR
40.0000 meq | EXTENDED_RELEASE_TABLET | Freq: Once | ORAL | Status: AC
  Administered 2014-12-13: 40 meq via ORAL
  Filled 2014-12-13: qty 2

## 2014-12-13 MED ORDER — ONDANSETRON HCL 4 MG PO TABS: 4.0000 mg | ORAL_TABLET | Freq: Four times a day (QID) | ORAL | Status: DC | PRN

## 2014-12-13 MED ORDER — TRAMADOL HCL 50 MG PO TABS: 50.0000 mg | ORAL_TABLET | Freq: Four times a day (QID) | ORAL | Status: DC | PRN

## 2014-12-13 MED ORDER — PRAVASTATIN SODIUM 40 MG PO TABS
40.0000 mg | ORAL_TABLET | Freq: Every day | ORAL | Status: DC
  Administered 2014-12-13 – 2014-12-14 (×2): 40 mg via ORAL
  Filled 2014-12-13 (×3): qty 1

## 2014-12-13 MED ORDER — SODIUM CHLORIDE 0.9 % IV BOLUS (SEPSIS)
1000.0000 mL | Freq: Once | INTRAVENOUS | Status: AC
  Administered 2014-12-13: 1000 mL via INTRAVENOUS

## 2014-12-13 MED ORDER — ASPIRIN 81 MG PO CHEW
81.0000 mg | CHEWABLE_TABLET | Freq: Every day | ORAL | Status: DC
  Administered 2014-12-14 – 2014-12-15 (×2): 81 mg via ORAL
  Filled 2014-12-13 (×2): qty 1

## 2014-12-13 MED ORDER — ACETAMINOPHEN 325 MG PO TABS
650.0000 mg | ORAL_TABLET | Freq: Four times a day (QID) | ORAL | Status: DC | PRN
  Administered 2014-12-13: 650 mg via ORAL
  Filled 2014-12-13: qty 2

## 2014-12-13 MED ORDER — ACETAMINOPHEN 650 MG RE SUPP: 650.0000 mg | Freq: Four times a day (QID) | RECTAL | Status: DC | PRN

## 2014-12-13 MED ORDER — SODIUM CHLORIDE 0.9 % IV BOLUS (SEPSIS): 500.0000 mL | Freq: Once | INTRAVENOUS | Status: DC

## 2014-12-13 MED ORDER — ONDANSETRON HCL 4 MG/2ML IJ SOLN
4.0000 mg | Freq: Once | INTRAMUSCULAR | Status: AC
  Administered 2014-12-13: 4 mg via INTRAVENOUS
  Filled 2014-12-13: qty 2

## 2014-12-13 MED ORDER — ASPIRIN 81 MG PO TABS: 81.0000 mg | ORAL_TABLET | Freq: Every day | ORAL | Status: DC

## 2014-12-13 MED ORDER — ONDANSETRON HCL 4 MG/2ML IJ SOLN: 4.0000 mg | Freq: Four times a day (QID) | INTRAMUSCULAR | Status: DC | PRN

## 2014-12-13 NOTE — ED Notes (Signed)
Patient insisted on using restroom immediately after arriving to ER. Patient's vitals taken and patient ambulated with no difficulty,
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
## 2014-12-13 NOTE — ED Notes (Signed)
He states that when he awoke from a nap and attempted to stand; he became dizzy, which persists.  He is alert and oriented x 4 with clear speech.  He insists upon ambulating to our bathroom, which he capably does, reporting having a "real soft, not quite diarrhea" stool.

## 2014-12-13 NOTE — ED Notes (Signed)
20 min to floor 15:18.

## 2014-12-13 NOTE — ED Notes (Signed)
E.D nurse notified and floor charge Marissa notified of 20 min time of arrival.,klj

## 2014-12-13 NOTE — ED Notes (Signed)
Bed: WA13 Expected date:  Expected time:  Means of arrival:  Comments: EMS-weakness 
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
## 2014-12-14 DIAGNOSIS — R55 Syncope and collapse: Secondary | ICD-10-CM | POA: Diagnosis not present

## 2014-12-14 DIAGNOSIS — R42 Dizziness and giddiness: Secondary | ICD-10-CM | POA: Diagnosis not present

## 2014-12-14 DIAGNOSIS — E860 Dehydration: Secondary | ICD-10-CM | POA: Diagnosis not present

## 2014-12-14 LAB — COMPREHENSIVE METABOLIC PANEL
ALBUMIN: 2.8 g/dL — AB (ref 3.5–5.2)
ALT: 16 U/L (ref 0–53)
AST: 18 U/L (ref 0–37)
Alkaline Phosphatase: 58 U/L (ref 39–117)
Anion gap: 10 (ref 5–15)
BUN: 10 mg/dL (ref 6–23)
CO2: 23 mEq/L (ref 19–32)
CREATININE: 1.08 mg/dL (ref 0.50–1.35)
Calcium: 8.7 mg/dL (ref 8.4–10.5)
Chloride: 110 mEq/L (ref 96–112)
GFR calc Af Amer: 79 mL/min — ABNORMAL LOW (ref >90)
GFR calc non Af Amer: 68 mL/min — ABNORMAL LOW (ref >90)
Glucose, Bld: 100 mg/dL — ABNORMAL HIGH (ref 70–99)
POTASSIUM: 3.7 meq/L (ref 3.7–5.3)
Sodium: 143 mEq/L (ref 137–147)
TOTAL PROTEIN: 5.5 g/dL — AB (ref 6.0–8.3)
Total Bilirubin: <0.2 mg/dL — ABNORMAL LOW (ref 0.3–1.2)

## 2014-12-14 LAB — CBC
HCT: 32.1 % — ABNORMAL LOW (ref 39.0–52.0)
HEMOGLOBIN: 10.6 g/dL — AB (ref 13.0–17.0)
MCH: 30.9 pg (ref 26.0–34.0)
MCHC: 33 g/dL (ref 30.0–36.0)
MCV: 93.6 fL (ref 78.0–100.0)
Platelets: 273 10*3/uL (ref 150–400)
RBC: 3.43 MIL/uL — ABNORMAL LOW (ref 4.22–5.81)
RDW: 13.5 % (ref 11.5–15.5)
WBC: 5.1 10*3/uL (ref 4.0–10.5)

## 2014-12-14 LAB — TROPONIN I: Troponin I: <0.3 ng/mL (ref <0.30)

## 2014-12-14 LAB — GLUCOSE, CAPILLARY: GLUCOSE-CAPILLARY: 94 mg/dL (ref 70–99)

## 2014-12-14 LAB — MAGNESIUM: MAGNESIUM: 1.6 mg/dL (ref 1.5–2.5)

## 2014-12-14 LAB — PROTIME-INR
INR: 1.06 (ref 0.00–1.49)
Prothrombin Time: 13.9 seconds (ref 11.6–15.2)

## 2014-12-14 LAB — APTT: APTT: 31 s (ref 24–37)

## 2014-12-14 MED ORDER — SODIUM CHLORIDE 0.9 % IV SOLN
INTRAVENOUS | Status: DC
  Administered 2014-12-14: 19:00:00 via INTRAVENOUS

## 2014-12-14 MED ORDER — ZOLPIDEM TARTRATE 5 MG PO TABS
5.0000 mg | ORAL_TABLET | Freq: Once | ORAL | Status: AC
  Administered 2014-12-15: 5 mg via ORAL
  Filled 2014-12-14: qty 1

## 2014-12-14 NOTE — Progress Notes (Signed)
[facility] PROGRESS NOTE  Brian Allen. [id number] DOB: [birth year] DOA: 12/13/2014 PCP: Cathlean Cower, MD  Assessment/Plan: Principal Problem:   Dizziness Active Problems:   Hyperlipidemia   Essential hypertension   Hypokalemia   HPI:  68 year old male with past medical history of alcohol abuse, hypertension, dyslipidemia who presented to [facility] ED with complaints of dizziness on the morning PTA. Patient reported when he attempted to get out of the bed he felt weak and dizzy. No fall and no loss of consciousness. No complaints of chest pain, shortness of breath or palpitations. No fevers or chills. No cough. No reports of diarrhea or nausea or vomiting. No reports of blurred vision.  In ED, initial BP was 98/55 but it has improved with IV fluids to 157/55. Blood work significant for mild leukopenia of 3.6, hemoglobin of 10.8 and potassium 3.5. His alcohol level was 14 on admission. When I informed him of this results (after i confirmed it was all right to report all blood work in front of his wife including alcohol level) patient became very loud and starting yelling that he does not drink and his last alcoholic beverage was 13 years ago.   Assessment & Plan    Principal Problem:  Dizziness Denies  alcohol use for several years - continue to monitor on telemetry, obtain 2 D echo Monitor troponin  - provide supportive care with IV fluids Tele shows NSR    Hyperlipidemia - resumed pravachol   Essential hypertension - initially hypotensive; BP improved with IV fluids - resume BP meds in am   Hypokalemia - supplemented in ED   DVT prophylaxis:  - SCD's bilaterally   Radiological Exams on Admission:  Imaging Results (Last 48 hours)    No results found.    EKG: sinus rhythm   Code Status: Full Family Communication: Plan of care discussed with the patient  Disposition Plan: Admit for further evaluation      Antibiotics: None    HPI/Subjective: Patient ambulating , BP still soft , had one episode of dizziness, denies drinking ETOH  Objective: Filed Vitals:   12/13/14 1539 12/13/14 2030 12/14/14 0610 12/14/14 0651  BP: 157/55 123/65 130/65   Pulse: 64 72 54   Temp: 98.3 F (36.8 C) 98.2 F (36.8 C) 98.3 F (36.8 C)   TempSrc: Oral Oral Oral   Resp:  18 18   Height: 5\' 5"  (1.651 m)     Weight: 77.3 kg (170 lb 6.7 oz)   77.2 kg (170 lb 3.1 oz)  SpO2: 94% 99% 96%     Intake/Output Summary (Last 24 hours) at 12/14/14 1404 Last data filed at 12/14/14 0900  Gross per 24 hour  Intake    240 ml  Output   1050 ml  Net   -810 ml    Exam:  General: alert & oriented x 3 In NAD  Cardiovascular: RRR, nl S1 s2  Respiratory: Decreased breath sounds at the bases, scattered rhonchi, no crackles  Abdomen: soft +BS NT/ND, no masses palpable  Extremities: No cyanosis and no edema      Data Reviewed: Basic Metabolic Panel:  Recent Labs Lab 12/13/14 1049 12/14/14 0415  NA 144 143  K 3.5* 3.7  CL 109 110  CO2 23 23  GLUCOSE 150* 100*  BUN 12 10  CREATININE 0.88 1.08  CALCIUM 8.4 8.7  MG  --  1.6    Liver Function Tests:  Recent Labs Lab 12/14/14 0415  AST 18  ALT  16  ALKPHOS 58  BILITOT <0.2*  PROT 5.5*  ALBUMIN 2.8*   No results for input(s): LIPASE, AMYLASE in the last 168 hours. No results for input(s): AMMONIA in the last 168 hours.  CBC:  Recent Labs Lab 12/13/14 1049 12/14/14 0415  WBC 3.6* 5.1  NEUTROABS 1.6*  --   HGB 10.8* 10.6*  HCT 31.0* 32.1*  MCV 91.4 93.6  PLT 255 273    Cardiac Enzymes:  Recent Labs Lab 12/13/14 1049  TROPONINI <0.30   BNP (last 3 results) No results for input(s): PROBNP in the last 8760 hours.   CBG:  Recent Labs Lab 12/14/14 0803  GLUCAP 94    No results found for this or any previous visit (from the past 240 hour(s)).   Studies: No results found.  Scheduled Meds: . aspirin  81 mg Oral Daily  . pravastatin  40 mg Oral  q1800  . sodium chloride  500 mL Intravenous Once  . sodium chloride  3 mL Intravenous Q12H   Continuous Infusions:   Principal Problem:   Dizziness Active Problems:   Hyperlipidemia   Essential hypertension   Hypokalemia    Time spent: 40 minutes   [facility] Pager [phone]. If 7PM-7AM, please contact night-coverage at [email or web address], password [facility] 12/14/2014, 2:04 PM  LOS: 1 day

## 2014-12-14 NOTE — Progress Notes (Signed)
  Echocardiogram 2D Echocardiogram has been performed.  Brian Allen FRANCES 12/14/2014, 3:38 PM

## 2014-12-14 NOTE — Progress Notes (Signed)
UR completed 

## 2014-12-14 NOTE — Progress Notes (Signed)
Nutrition Brief Note  Patient identified on the Malnutrition Screening Tool (MST) Report  Wt Readings from Last 15 Encounters:  12/14/14 170 lb 3.1 oz (77.2 kg)  10/19/14 159 lb 2 oz (72.179 kg)  07/20/14 154 lb (69.854 kg)  07/20/14 154 lb (69.854 kg)  05/03/14 158 lb (71.668 kg)  04/19/14 167 lb (75.751 kg)  02/17/14 164 lb 12.8 oz (74.753 kg)  01/17/14 163 lb (73.936 kg)  01/11/14 162 lb 9.6 oz (73.755 kg)  12/28/13 166 lb 12 oz (75.637 kg)  10/18/13 162 lb (73.483 kg)  03/24/13 162 lb (73.483 kg)  09/23/12 161 lb 2 oz (73.086 kg)  06/23/12 162 lb (73.483 kg)  03/24/12 171 lb 8 oz (77.792 kg)    Body mass index is 28.32 kg/(m^2). Patient meets criteria for Overweight based on current BMI.   Current diet order is Regular, patient is consuming approximately 100% of meals at this time. Family bringing in meals. Labs and medications reviewed.   Previous medical records indicates pt with a recent 10 lb weight gain over past 2-3 months. Is eating well during admit, denied any changes in appetite per nutrition screen.  No nutrition interventions warranted at this time. If nutrition issues arise, please consult RD.   Atlee Abide MS RD LDN Clinical Dietitian [phone]

## 2014-12-15 DIAGNOSIS — E860 Dehydration: Secondary | ICD-10-CM | POA: Diagnosis not present

## 2014-12-15 DIAGNOSIS — R42 Dizziness and giddiness: Secondary | ICD-10-CM | POA: Diagnosis not present

## 2014-12-15 LAB — COMPREHENSIVE METABOLIC PANEL
ALBUMIN: 2.7 g/dL — AB (ref 3.5–5.2)
ALT: 17 U/L (ref 0–53)
AST: 17 U/L (ref 0–37)
Alkaline Phosphatase: 54 U/L (ref 39–117)
Anion gap: 10 (ref 5–15)
BUN: 9 mg/dL (ref 6–23)
CHLORIDE: 111 meq/L (ref 96–112)
CO2: 24 mEq/L (ref 19–32)
Calcium: 8.6 mg/dL (ref 8.4–10.5)
Creatinine, Ser: 1 mg/dL (ref 0.50–1.35)
GFR calc Af Amer: 87 mL/min — ABNORMAL LOW (ref >90)
GFR calc non Af Amer: 75 mL/min — ABNORMAL LOW (ref >90)
Glucose, Bld: 97 mg/dL (ref 70–99)
Potassium: 3.5 mEq/L — ABNORMAL LOW (ref 3.7–5.3)
SODIUM: 145 meq/L (ref 137–147)
Total Bilirubin: 0.2 mg/dL — ABNORMAL LOW (ref 0.3–1.2)
Total Protein: 5.4 g/dL — ABNORMAL LOW (ref 6.0–8.3)

## 2014-12-15 LAB — GLUCOSE, CAPILLARY: GLUCOSE-CAPILLARY: 77 mg/dL (ref 70–99)

## 2014-12-15 LAB — CBC
HCT: 31.7 % — ABNORMAL LOW (ref 39.0–52.0)
Hemoglobin: 10.6 g/dL — ABNORMAL LOW (ref 13.0–17.0)
MCH: 30.5 pg (ref 26.0–34.0)
MCHC: 33.4 g/dL (ref 30.0–36.0)
MCV: 91.4 fL (ref 78.0–100.0)
PLATELETS: 262 10*3/uL (ref 150–400)
RBC: 3.47 MIL/uL — ABNORMAL LOW (ref 4.22–5.81)
RDW: 13.3 % (ref 11.5–15.5)
WBC: 4.2 10*3/uL (ref 4.0–10.5)

## 2014-12-15 NOTE — Progress Notes (Signed)
Pt discharged to home. DC instructions given with male family member at bedside. No concerns voiced . Left unit in wheelchair pushed by nurse tech.  Left in good condition. Copy of discharge summary given.  Vwilliams,rn.

## 2014-12-15 NOTE — Progress Notes (Signed)
UR completed 

## 2014-12-15 NOTE — Discharge Summary (Addendum)
Physician Discharge Summary  Brian Allen. MRN: [id number] DOB/AGE: 1946/04/11 68 y.o.  PCP: Cathlean Cower, MD   Admit date: 12/13/2014 Discharge date: 12/15/2014  Discharge Diagnoses:    Hypotension related to Dehydration  Anemia that needs further outpatient workup   Hyperlipidemia   Essential hypertension   Hypokalemia   Follow-up recommendations follow-up with PCP in 5-7 days  follow-up CBC, anemia panel, CMP in 1 week     Medication List    STOP taking these medications        hydrochlorothiazide 25 MG tablet  Commonly known as:  HYDRODIURIL      TAKE these medications        aspirin 81 MG tablet  Take 81 mg by mouth daily.     famotidine 20 MG tablet  Commonly known as:  PEPCID  Take 1 tablet (20 mg total) by mouth 2 (two) times daily.     vardenafil 20 MG tablet  Commonly known as:  LEVITRA  Take 20 mg by mouth daily as needed for erectile dysfunction.     LEVITRA 20 MG tablet  Generic drug:  vardenafil  TAKE ONE TABLET AS NEEDED FOR ERECTILE DYSFUNCTION.     lisinopril 20 MG tablet  Commonly known as:  PRINIVIL,ZESTRIL  Take 1 tablet (20 mg total) by mouth every morning.     lovastatin 40 MG tablet  Commonly known as:  MEVACOR  Take 1 tablet (40 mg total) by mouth at bedtime.     tiZANidine 4 MG tablet  Commonly known as:  ZANAFLEX  Take 1 tablet (4 mg total) by mouth every 6 (six) hours as needed for muscle spasms.        Discharge Condition: Stable Disposition: 01-Home or Self Care   Consults: None  Significant Diagnostic Studies: No results found.   2-D echo Left ventricle: The cavity size was normal. Systolic function was normal. The estimated ejection fraction was in the range of 55% to 60%. Wall motion was normal; there were no regional wall motion abnormalities. - Aortic root: The aortic root was mildly dilated. - Left atrium: The atrium was mildly dilated. - Pulmonary arteries: Systolic pressure was  mildly increased. PA peak pressure: 35 mm Hg (S).  Microbiology: No results found for this or any previous visit (from the past 240 hour(s)).   Labs: Results for orders placed or performed during the hospital encounter of 12/13/14 (from the past 48 hour(s))  Ethanol     Status: Abnormal   Collection Time: 12/13/14  5:59 PM  Result Value Ref Range   Alcohol, Ethyl (B) 14 (H) 0 - 11 mg/dL    Comment:        LOWEST DETECTABLE LIMIT FOR SERUM ALCOHOL IS 11 mg/dL FOR MEDICAL PURPOSES ONLY   Urine rapid drug screen (hosp performed)     Status: None   Collection Time: 12/13/14  8:12 PM  Result Value Ref Range   Opiates NONE DETECTED NONE DETECTED   Cocaine NONE DETECTED NONE DETECTED   Benzodiazepines NONE DETECTED NONE DETECTED   Amphetamines NONE DETECTED NONE DETECTED   Tetrahydrocannabinol NONE DETECTED NONE DETECTED   Barbiturates NONE DETECTED NONE DETECTED    Comment:        DRUG SCREEN FOR MEDICAL PURPOSES ONLY.  IF CONFIRMATION IS NEEDED FOR ANY PURPOSE, NOTIFY LAB WITHIN 5 DAYS.        LOWEST DETECTABLE LIMITS FOR URINE DRUG SCREEN Drug Class       Cutoff (ng/mL) Amphetamine  1000 Barbiturate      200 Benzodiazepine   200 Tricyclics       300 Opiates          300 Cocaine          300 THC              50   Comprehensive metabolic panel     Status: Abnormal   Collection Time: 12/14/14  4:15 AM  Result Value Ref Range   Sodium 143 137 - 147 mEq/L   Potassium 3.7 3.7 - 5.3 mEq/L   Chloride 110 96 - 112 mEq/L   CO2 23 19 - 32 mEq/L   Glucose, Bld 100 (H) 70 - 99 mg/dL   BUN 10 6 - 23 mg/dL   Creatinine, Ser 1.08 0.50 - 1.35 mg/dL   Calcium 8.7 8.4 - 10.5 mg/dL   Total Protein 5.5 (L) 6.0 - 8.3 g/dL   Albumin 2.8 (L) 3.5 - 5.2 g/dL   AST 18 0 - 37 U/L   ALT 16 0 - 53 U/L   Alkaline Phosphatase 58 39 - 117 U/L   Total Bilirubin <0.2 (L) 0.3 - 1.2 mg/dL   GFR calc non Af Amer 68 (L) >90 mL/min   GFR calc Af Amer 79 (L) >90 mL/min    Comment: (NOTE) The  eGFR has been calculated using the CKD EPI equation. This calculation has not been validated in all clinical situations. eGFR's persistently <90 mL/min signify possible Chronic Kidney Disease.    Anion gap 10 5 - 15  CBC     Status: Abnormal   Collection Time: 12/14/14  4:15 AM  Result Value Ref Range   WBC 5.1 4.0 - 10.5 K/uL   RBC 3.43 (L) 4.22 - 5.81 MIL/uL   Hemoglobin 10.6 (L) 13.0 - 17.0 g/dL   HCT 32.1 (L) 39.0 - 52.0 %   MCV 93.6 78.0 - 100.0 fL   MCH 30.9 26.0 - 34.0 pg   MCHC 33.0 30.0 - 36.0 g/dL   RDW 13.5 11.5 - 15.5 %   Platelets 273 150 - 400 K/uL  Protime-INR     Status: None   Collection Time: 12/14/14  4:15 AM  Result Value Ref Range   Prothrombin Time 13.9 11.6 - 15.2 seconds   INR 1.06 0.00 - 1.49  APTT     Status: None   Collection Time: 12/14/14  4:15 AM  Result Value Ref Range   aPTT 31 24 - 37 seconds  Magnesium     Status: None   Collection Time: 12/14/14  4:15 AM  Result Value Ref Range   Magnesium 1.6 1.5 - 2.5 mg/dL  Glucose, capillary     Status: None   Collection Time: 12/14/14  8:03 AM  Result Value Ref Range   Glucose-Capillary 94 70 - 99 mg/dL  Troponin I     Status: None   Collection Time: 12/14/14  4:45 PM  Result Value Ref Range   Troponin I <0.30 <0.30 ng/mL    Comment:        Due to the release kinetics of cTnI, a negative result within the first hours of the onset of symptoms does not rule out myocardial infarction with certainty. If myocardial infarction is still suspected, repeat the test at appropriate intervals.   CBC     Status: Abnormal   Collection Time: 12/15/14  4:50 AM  Result Value Ref Range   WBC 4.2 4.0 - 10.5 K/uL   RBC   3.47 (L) 4.22 - 5.81 MIL/uL   Hemoglobin 10.6 (L) 13.0 - 17.0 g/dL   HCT 31.7 (L) 39.0 - 52.0 %   MCV 91.4 78.0 - 100.0 fL   MCH 30.5 26.0 - 34.0 pg   MCHC 33.4 30.0 - 36.0 g/dL   RDW 13.3 11.5 - 15.5 %   Platelets 262 150 - 400 K/uL  Comprehensive metabolic panel     Status: Abnormal    Collection Time: 12/15/14  4:50 AM  Result Value Ref Range   Sodium 145 137 - 147 mEq/L   Potassium 3.5 (L) 3.7 - 5.3 mEq/L   Chloride 111 96 - 112 mEq/L   CO2 24 19 - 32 mEq/L   Glucose, Bld 97 70 - 99 mg/dL   BUN 9 6 - 23 mg/dL   Creatinine, Ser 1.00 0.50 - 1.35 mg/dL   Calcium 8.6 8.4 - 10.5 mg/dL   Total Protein 5.4 (L) 6.0 - 8.3 g/dL   Albumin 2.7 (L) 3.5 - 5.2 g/dL   AST 17 0 - 37 U/L   ALT 17 0 - 53 U/L   Alkaline Phosphatase 54 39 - 117 U/L   Total Bilirubin 0.2 (L) 0.3 - 1.2 mg/dL   GFR calc non Af Amer 75 (L) >90 mL/min   GFR calc Af Amer 87 (L) >90 mL/min    Comment: (NOTE) The eGFR has been calculated using the CKD EPI equation. This calculation has not been validated in all clinical situations. eGFR's persistently <90 mL/min signify possible Chronic Kidney Disease.    Anion gap 10 5 - 15  Glucose, capillary     Status: None   Collection Time: 12/15/14  7:25 AM  Result Value Ref Range   Glucose-Capillary 77 70 - 99 mg/dL     HPI:  67 year old male with past medical history of alcohol abuse, hypertension, dyslipidemia who presented to [facility] ED with complaints of dizziness on the morning PTA. Patient reported when he attempted to get out of the bed he felt weak and dizzy. No fall and no loss of consciousness. No complaints of chest pain, shortness of breath or palpitations. No fevers or chills. No cough. No reports of diarrhea or nausea or vomiting. No reports of blurred vision. No gross focal neurologic deficits, urine drug screen negative. In ED, initial BP was 98/55 but it has improved with IV fluids to 157/55. Blood work significant for mild leukopenia of 3.6, hemoglobin of 10.8 and potassium 3.5. His alcohol level was 14 on admission. Patient states that he has not had alcoholic beverage was 13 years ago.   Assessment & Plan    Principal Problem:  Dizziness probably secondary to dehydration Denies alcohol use for several years - telemetry, cardiac enzymes,  2-D echo uneventful Monitor troponin  Patient hydrated with IV fluids with improvement in his blood pressure Hydrochlorothiazide discontinued Patient to continue with his ACE inhibitor   Hyperlipidemia - resumed pravachol   Essential hypertension - initially hypotensive; BP improved with IV fluids Blood pressure medicines as above   Hypokalemia - supplemented in ED He did repeat BMP in one week  Normocytic anemia Defer management to PCP    Discharge Exam:  Blood pressure 156/70, pulse 59, temperature 97.9 F (36.6 C), temperature source Oral, resp. rate 18, height 5' 5" (1.651 m), weight 78.5 kg (173 lb 1 oz), SpO2 99 %.   General: alert & oriented x 3 In NAD   Cardiovascular: RRR, nl S1 s2   Respiratory: Decreased   breath sounds at the bases, scattered rhonchi, no crackles   Abdomen: soft +BS NT/ND, no masses palpable   Extremities: No cyanosis and no edema       Discharge Instructions    Diet - low sodium heart healthy    Complete by:  As directed      Increase activity slowly    Complete by:  As directed            Follow-up Information    Follow up with James John, MD. Schedule an appointment as soon as possible for a visit in 1 week.   Specialties:  Internal Medicine, Radiology   Why:  Patient needs anemia workup   Contact information:   [address] [place] [address] [phone]        Signed: ABROL,NAYANA 12/15/2014, 1:34 PM          

## 2014-12-19 ENCOUNTER — Encounter: Payer: Self-pay | Admitting: Internal Medicine

## 2014-12-19 ENCOUNTER — Other Ambulatory Visit (INDEPENDENT_AMBULATORY_CARE_PROVIDER_SITE_OTHER): Payer: Medicare Other

## 2014-12-19 ENCOUNTER — Ambulatory Visit (INDEPENDENT_AMBULATORY_CARE_PROVIDER_SITE_OTHER): Payer: Medicare Other | Admitting: Internal Medicine

## 2014-12-19 VITALS — BP 140/86 | HR 69 | Temp 98.2°F | Ht 65.0 in | Wt 168.0 lb

## 2014-12-19 DIAGNOSIS — D649 Anemia, unspecified: Secondary | ICD-10-CM | POA: Diagnosis not present

## 2014-12-19 DIAGNOSIS — E785 Hyperlipidemia, unspecified: Secondary | ICD-10-CM | POA: Diagnosis not present

## 2014-12-19 DIAGNOSIS — E538 Deficiency of other specified B group vitamins: Secondary | ICD-10-CM | POA: Diagnosis not present

## 2014-12-19 DIAGNOSIS — R42 Dizziness and giddiness: Secondary | ICD-10-CM

## 2014-12-19 DIAGNOSIS — I10 Essential (primary) hypertension: Secondary | ICD-10-CM

## 2014-12-19 LAB — CBC WITH DIFFERENTIAL/PLATELET
Basophils Absolute: 0 10*3/uL (ref 0.0–0.1)
Basophils Relative: 0.7 % (ref 0.0–3.0)
Eosinophils Absolute: 0.2 10*3/uL (ref 0.0–0.7)
Eosinophils Relative: 4.6 % (ref 0.0–5.0)
HCT: 34.1 % — ABNORMAL LOW (ref 39.0–52.0)
HEMOGLOBIN: 11.2 g/dL — AB (ref 13.0–17.0)
Lymphocytes Relative: 35.8 % (ref 12.0–46.0)
Lymphs Abs: 1.9 10*3/uL (ref 0.7–4.0)
MCHC: 32.7 g/dL (ref 30.0–36.0)
MCV: 93.7 fl (ref 78.0–100.0)
MONOS PCT: 9.3 % (ref 3.0–12.0)
Monocytes Absolute: 0.5 10*3/uL (ref 0.1–1.0)
Neutro Abs: 2.7 10*3/uL (ref 1.4–7.7)
Neutrophils Relative %: 49.6 % (ref 43.0–77.0)
Platelets: 286 10*3/uL (ref 150.0–400.0)
RBC: 3.64 Mil/uL — ABNORMAL LOW (ref 4.22–5.81)
RDW: 14.1 % (ref 11.5–15.5)
WBC: 5.4 10*3/uL (ref 4.0–10.5)

## 2014-12-19 LAB — RETICULOCYTES
ABS Retic: 76.2 10*3/uL (ref 19.0–186.0)
RBC.: 3.63 MIL/uL — ABNORMAL LOW (ref 4.22–5.81)
RETIC CT PCT: 2.1 % (ref 0.4–2.3)

## 2014-12-19 LAB — IBC PANEL
Iron: 51 ug/dL (ref 42–165)
Saturation Ratios: 17.1 % — ABNORMAL LOW (ref 20.0–50.0)
Transferrin: 212.8 mg/dL (ref 212.0–360.0)

## 2014-12-19 MED ORDER — ATORVASTATIN CALCIUM 10 MG PO TABS: 10.0000 mg | ORAL_TABLET | Freq: Every day | ORAL | Status: DC

## 2014-12-19 MED ORDER — LISINOPRIL 20 MG PO TABS: 20.0000 mg | ORAL_TABLET | Freq: Every morning | ORAL | Status: DC

## 2014-12-19 NOTE — Progress Notes (Signed)
Pre visit review using our clinic review tool, if applicable. No additional management support is needed unless otherwise documented below in the visit note. 

## 2014-12-19 NOTE — Patient Instructions (Signed)
OK to re-start the lisinopril at 20 mg per day  OK to stay OFF the fluid pill  OK to stop the lovastatin for cholesterol (that you are not taking anyway)  Please take all new medication as prescribed - the lipitor  Please continue all other medications as before, and refills have been done if requested.  Please have the pharmacy call with any other refills you may need.  Please continue your efforts at being more active, low cholesterol diet, and weight control.  Please keep your appointments with your specialists as you may have planned  Please go to the LAB in the Basement (turn left off the elevator) for the tests to be done today  You will be contacted by phone if any changes need to be made immediately.  Otherwise, you will receive a letter about your results with an explanation, but please check with MyChart first.  Please remember to sign up for MyChart if you have not done so, as this will be important to you in the future with finding out test results, communicating by private email, and scheduling acute appointments online when needed.
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
## 2014-12-28 DIAGNOSIS — D649 Anemia, unspecified: Secondary | ICD-10-CM | POA: Insufficient documentation

## 2014-12-28 NOTE — Assessment & Plan Note (Signed)
Resolved post tx for dehydration, cont same tx, taking po well,  to f/u any worsening symptoms or concerns

## 2014-12-28 NOTE — Assessment & Plan Note (Signed)
stable overall by history and exam, recent data reviewed with pt, and pt to re-start ACEI, but not the HCT with which he apparently had complication of tx/low volume,  to f/u any worsening symptoms or concerns BP Readings from Last 3 Encounters:  12/19/14 140/86  12/15/14 156/70  10/19/14 140/90

## 2014-12-28 NOTE — Assessment & Plan Note (Signed)
For f/u lab today, o/w clinically stable,  to f/u any worsening symptoms or concerns

## 2014-12-28 NOTE — Assessment & Plan Note (Signed)
Ok per pt request, change lovastatin to lipitor, to f/u any worsening symptoms or concerns

## 2015-01-16 ENCOUNTER — Telehealth: Payer: Self-pay | Admitting: Internal Medicine

## 2015-01-16 ENCOUNTER — Ambulatory Visit: Payer: Medicare Other | Admitting: Internal Medicine

## 2015-01-16 NOTE — Telephone Encounter (Signed)
Patient no showed for fu.  Please advise.   °

## 2015-01-17 DIAGNOSIS — H25813 Combined forms of age-related cataract, bilateral: Secondary | ICD-10-CM | POA: Diagnosis not present

## 2015-01-17 DIAGNOSIS — H4011X3 Primary open-angle glaucoma, severe stage: Secondary | ICD-10-CM | POA: Diagnosis not present

## 2015-01-17 NOTE — Telephone Encounter (Signed)
Ok to f/u with OV at his convenience

## 2015-01-18 NOTE — Telephone Encounter (Signed)
Got patient scheduled for 27th.

## 2015-01-24 ENCOUNTER — Ambulatory Visit (INDEPENDENT_AMBULATORY_CARE_PROVIDER_SITE_OTHER): Payer: Medicare Other | Admitting: Internal Medicine

## 2015-01-24 ENCOUNTER — Encounter: Payer: Self-pay | Admitting: Internal Medicine

## 2015-01-24 VITALS — BP 150/100 | HR 82 | Temp 98.3°F | Ht 65.0 in | Wt 161.1 lb

## 2015-01-24 DIAGNOSIS — E785 Hyperlipidemia, unspecified: Secondary | ICD-10-CM

## 2015-01-24 DIAGNOSIS — N400 Enlarged prostate without lower urinary tract symptoms: Secondary | ICD-10-CM | POA: Diagnosis not present

## 2015-01-24 DIAGNOSIS — I10 Essential (primary) hypertension: Secondary | ICD-10-CM

## 2015-01-24 DIAGNOSIS — C61 Malignant neoplasm of prostate: Secondary | ICD-10-CM | POA: Diagnosis not present

## 2015-01-24 MED ORDER — AMLODIPINE BESYLATE 5 MG PO TABS: 5.0000 mg | ORAL_TABLET | Freq: Every day | ORAL | Status: DC

## 2015-01-24 MED ORDER — TAMSULOSIN HCL 0.4 MG PO CAPS: 0.4000 mg | ORAL_CAPSULE | Freq: Every day | ORAL | Status: DC

## 2015-01-24 NOTE — Assessment & Plan Note (Signed)
For flomax re-start,  to f/u any worsening symptoms or concerns

## 2015-01-24 NOTE — Assessment & Plan Note (Signed)
To start amlodipine 5 qd, avoid re-start hct for now as could aggrevate incontinence

## 2015-01-24 NOTE — Assessment & Plan Note (Signed)
D/w pt, ok to take the lipitor only, will hold on lipid check today as he has been taking lovastatin for the past wk Lab Results  Component Value Date   LDLCALC 82 03/24/2013

## 2015-01-24 NOTE — Progress Notes (Signed)
Pre visit review using our clinic review tool, if applicable. No additional management support is needed unless otherwise documented below in the visit note. 

## 2015-01-24 NOTE — Patient Instructions (Addendum)
Ok to take the Lipitor (atorvastatin) only for high cholesterol  OK to start Amlodipine (Norvasc) 5 mg per day for blood pressure  Please continue all other medications as before, includiing the flomax  Please have the pharmacy call with any other refills you may need.  Please keep your appointments with your specialists as you may have planned  Please remember to keep your appt with Dr Risa Grill on Feb 8  Please only take the medications on your current medication list
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
## 2015-01-24 NOTE — Assessment & Plan Note (Signed)
Has fu feb 8 with urology, encouraged to keep appt

## 2015-02-05 DIAGNOSIS — N3281 Overactive bladder: Secondary | ICD-10-CM | POA: Diagnosis not present

## 2015-02-05 DIAGNOSIS — C61 Malignant neoplasm of prostate: Secondary | ICD-10-CM | POA: Diagnosis not present

## 2015-02-05 DIAGNOSIS — N5201 Erectile dysfunction due to arterial insufficiency: Secondary | ICD-10-CM | POA: Diagnosis not present

## 2015-02-12 DIAGNOSIS — H409 Unspecified glaucoma: Secondary | ICD-10-CM | POA: Diagnosis not present

## 2015-02-12 DIAGNOSIS — H269 Unspecified cataract: Secondary | ICD-10-CM | POA: Diagnosis not present

## 2015-02-12 DIAGNOSIS — H25811 Combined forms of age-related cataract, right eye: Secondary | ICD-10-CM | POA: Diagnosis not present

## 2015-02-12 DIAGNOSIS — H25813 Combined forms of age-related cataract, bilateral: Secondary | ICD-10-CM | POA: Diagnosis not present

## 2015-02-20 ENCOUNTER — Encounter: Payer: Self-pay | Admitting: Gastroenterology

## 2015-04-20 ENCOUNTER — Ambulatory Visit: Payer: Medicare Other | Admitting: Internal Medicine

## 2015-04-24 ENCOUNTER — Encounter: Payer: Self-pay | Admitting: Internal Medicine

## 2015-04-24 ENCOUNTER — Other Ambulatory Visit (INDEPENDENT_AMBULATORY_CARE_PROVIDER_SITE_OTHER): Payer: Medicare Other

## 2015-04-24 ENCOUNTER — Ambulatory Visit (INDEPENDENT_AMBULATORY_CARE_PROVIDER_SITE_OTHER): Payer: Medicare Other | Admitting: Internal Medicine

## 2015-04-24 VITALS — BP 138/88 | HR 68 | Temp 98.9°F | Resp 18 | Ht 65.0 in | Wt 160.0 lb

## 2015-04-24 DIAGNOSIS — N320 Bladder-neck obstruction: Secondary | ICD-10-CM

## 2015-04-24 DIAGNOSIS — D649 Anemia, unspecified: Secondary | ICD-10-CM | POA: Diagnosis not present

## 2015-04-24 DIAGNOSIS — I10 Essential (primary) hypertension: Secondary | ICD-10-CM

## 2015-04-24 DIAGNOSIS — E785 Hyperlipidemia, unspecified: Secondary | ICD-10-CM | POA: Diagnosis not present

## 2015-04-24 LAB — HEPATIC FUNCTION PANEL
ALT: 27 U/L (ref 0–53)
AST: 24 U/L (ref 0–37)
Albumin: 4.1 g/dL (ref 3.5–5.2)
Alkaline Phosphatase: 69 U/L (ref 39–117)
BILIRUBIN DIRECT: 0.1 mg/dL (ref 0.0–0.3)
BILIRUBIN TOTAL: 0.4 mg/dL (ref 0.2–1.2)
Total Protein: 6.3 g/dL (ref 6.0–8.3)

## 2015-04-24 LAB — CBC WITH DIFFERENTIAL/PLATELET
Basophils Absolute: 0 10*3/uL (ref 0.0–0.1)
Basophils Relative: 1 % (ref 0.0–3.0)
Eosinophils Absolute: 0.1 10*3/uL (ref 0.0–0.7)
Eosinophils Relative: 3.1 % (ref 0.0–5.0)
HEMATOCRIT: 34.6 % — AB (ref 39.0–52.0)
Hemoglobin: 11.6 g/dL — ABNORMAL LOW (ref 13.0–17.0)
LYMPHS ABS: 1.3 10*3/uL (ref 0.7–4.0)
LYMPHS PCT: 28.8 % (ref 12.0–46.0)
MCHC: 33.4 g/dL (ref 30.0–36.0)
MCV: 90.9 fl (ref 78.0–100.0)
MONOS PCT: 8.9 % (ref 3.0–12.0)
Monocytes Absolute: 0.4 10*3/uL (ref 0.1–1.0)
NEUTROS ABS: 2.7 10*3/uL (ref 1.4–7.7)
Neutrophils Relative %: 58.2 % (ref 43.0–77.0)
Platelets: 326 10*3/uL (ref 150.0–400.0)
RBC: 3.8 Mil/uL — AB (ref 4.22–5.81)
RDW: 15.1 % (ref 11.5–15.5)
WBC: 4.6 10*3/uL (ref 4.0–10.5)

## 2015-04-24 LAB — LIPID PANEL
CHOLESTEROL: 196 mg/dL (ref 0–200)
HDL: 60.5 mg/dL (ref >39.00)
LDL Cholesterol: 107 mg/dL — ABNORMAL HIGH (ref 0–99)
NonHDL: 135.5
Total CHOL/HDL Ratio: 3
Triglycerides: 145 mg/dL (ref 0.0–149.0)
VLDL: 29 mg/dL (ref 0.0–40.0)

## 2015-04-24 LAB — BASIC METABOLIC PANEL
BUN: 7 mg/dL (ref 6–23)
CALCIUM: 9.2 mg/dL (ref 8.4–10.5)
CHLORIDE: 106 meq/L (ref 96–112)
CO2: 31 mEq/L (ref 19–32)
Creatinine, Ser: 0.9 mg/dL (ref 0.40–1.50)
GFR: 107.18 mL/min (ref >60.00)
Glucose, Bld: 104 mg/dL — ABNORMAL HIGH (ref 70–99)
POTASSIUM: 3.3 meq/L — AB (ref 3.5–5.1)
Sodium: 141 mEq/L (ref 135–145)

## 2015-04-24 LAB — URINALYSIS, ROUTINE W REFLEX MICROSCOPIC
BILIRUBIN URINE: NEGATIVE
HGB URINE DIPSTICK: NEGATIVE
KETONES UR: NEGATIVE
Leukocytes, UA: NEGATIVE
Nitrite: NEGATIVE
RBC / HPF: NONE SEEN (ref 0–?)
Specific Gravity, Urine: <=1.005 — AB (ref 1.000–1.030)
Total Protein, Urine: NEGATIVE
Urine Glucose: NEGATIVE
Urobilinogen, UA: 0.2 (ref 0.0–1.0)
pH: 6 (ref 5.0–8.0)

## 2015-04-24 LAB — TSH: TSH: 1.11 u[IU]/mL (ref 0.35–4.50)

## 2015-04-24 LAB — PSA: PSA: 0.89 ng/mL (ref 0.10–4.00)

## 2015-04-24 NOTE — Assessment & Plan Note (Signed)
stable overall by history and exam, recent data reviewed with pt, and pt to continue medical treatment as before,  to f/u any worsening symptoms or concerns BP Readings from Last 3 Encounters:  04/24/15 138/88  01/24/15 150/100  12/19/14 140/86

## 2015-04-24 NOTE — Progress Notes (Signed)
Pre visit review using our clinic review tool, if applicable. No additional management support is needed unless otherwise documented below in the visit note. 

## 2015-04-24 NOTE — Patient Instructions (Addendum)
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
## 2015-04-24 NOTE — Assessment & Plan Note (Signed)
stable overall by history and exam, recent data reviewed with pt, and pt to continue medical treatment as before,  to f/u any worsening symptoms or concerns, no overt bleeding, for f/u lab

## 2015-04-24 NOTE — Assessment & Plan Note (Signed)
stable overall by history and exam, recent data reviewed with pt, and pt to continue medical treatment as before,  to f/u any worsening symptoms or concerns Lab Results  Component Value Date   LDLCALC 82 03/24/2013

## 2015-04-26 ENCOUNTER — Telehealth: Payer: Self-pay | Admitting: Internal Medicine

## 2015-04-26 NOTE — Telephone Encounter (Signed)
Patient was in on 04/26 and he says he needed all his medication refilled. I tried to go through his medication with him, but he through all his medication bottles away. Can you please call him to go over his medication with him.

## 2015-04-27 MED ORDER — FAMOTIDINE 20 MG PO TABS: 20.0000 mg | ORAL_TABLET | Freq: Two times a day (BID) | ORAL | Status: DC

## 2015-04-27 MED ORDER — AMLODIPINE BESYLATE 5 MG PO TABS: 5.0000 mg | ORAL_TABLET | Freq: Every day | ORAL | Status: DC

## 2015-04-27 MED ORDER — LISINOPRIL 20 MG PO TABS
20.0000 mg | ORAL_TABLET | Freq: Every morning | ORAL | Status: DC
Start: 2015-04-27 — End: 2015-07-03

## 2015-04-27 MED ORDER — ATORVASTATIN CALCIUM 10 MG PO TABS: 10.0000 mg | ORAL_TABLET | Freq: Every day | ORAL | Status: DC

## 2015-04-27 MED ORDER — TAMSULOSIN HCL 0.4 MG PO CAPS: 0.4000 mg | ORAL_CAPSULE | Freq: Every day | ORAL | Status: DC

## 2015-04-27 NOTE — Telephone Encounter (Signed)
Refill all maintenance meds called pt no answer LMOM refills sent to [facility]...Brian Allen

## 2015-06-30 ENCOUNTER — Emergency Department (HOSPITAL_COMMUNITY)
Admission: EM | Admit: 2015-06-30 | Discharge: 2015-06-30 | Disposition: A | Discharge status: Home / Self Care | Payer: Medicare Other | Attending: Emergency Medicine | Admitting: Emergency Medicine

## 2015-06-30 DIAGNOSIS — Z7982 Long term (current) use of aspirin: Secondary | ICD-10-CM | POA: Diagnosis not present

## 2015-06-30 DIAGNOSIS — Z87828 Personal history of other (healed) physical injury and trauma: Secondary | ICD-10-CM | POA: Diagnosis not present

## 2015-06-30 DIAGNOSIS — N529 Male erectile dysfunction, unspecified: Secondary | ICD-10-CM | POA: Insufficient documentation

## 2015-06-30 DIAGNOSIS — Z79899 Other long term (current) drug therapy: Secondary | ICD-10-CM | POA: Insufficient documentation

## 2015-06-30 DIAGNOSIS — Z87891 Personal history of nicotine dependence: Secondary | ICD-10-CM | POA: Insufficient documentation

## 2015-06-30 DIAGNOSIS — H53149 Visual discomfort, unspecified: Secondary | ICD-10-CM | POA: Diagnosis not present

## 2015-06-30 DIAGNOSIS — R112 Nausea with vomiting, unspecified: Secondary | ICD-10-CM | POA: Diagnosis not present

## 2015-06-30 DIAGNOSIS — Z8546 Personal history of malignant neoplasm of prostate: Secondary | ICD-10-CM | POA: Insufficient documentation

## 2015-06-30 DIAGNOSIS — I10 Essential (primary) hypertension: Secondary | ICD-10-CM | POA: Insufficient documentation

## 2015-06-30 DIAGNOSIS — R51 Headache: Secondary | ICD-10-CM | POA: Insufficient documentation

## 2015-06-30 DIAGNOSIS — M159 Polyosteoarthritis, unspecified: Secondary | ICD-10-CM | POA: Diagnosis not present

## 2015-06-30 DIAGNOSIS — E785 Hyperlipidemia, unspecified: Secondary | ICD-10-CM | POA: Diagnosis not present

## 2015-06-30 DIAGNOSIS — R519 Headache, unspecified: Secondary | ICD-10-CM

## 2015-06-30 LAB — PROTIME-INR
INR: 1 (ref 0.00–1.49)
PROTHROMBIN TIME: 13.4 s (ref 11.6–15.2)

## 2015-06-30 LAB — I-STAT CHEM 8, ED
BUN: 12 mg/dL (ref 6–20)
CHLORIDE: 106 mmol/L (ref 101–111)
Calcium, Ion: 1.14 mmol/L (ref 1.13–1.30)
Creatinine, Ser: 0.8 mg/dL (ref 0.61–1.24)
Glucose, Bld: 106 mg/dL — ABNORMAL HIGH (ref 65–99)
HEMATOCRIT: 40 % (ref 39.0–52.0)
HEMOGLOBIN: 13.6 g/dL (ref 13.0–17.0)
Potassium: 3.5 mmol/L (ref 3.5–5.1)
Sodium: 142 mmol/L (ref 135–145)
TCO2: 23 mmol/L (ref 0–100)

## 2015-06-30 LAB — CBC
HCT: 37.4 % — ABNORMAL LOW (ref 39.0–52.0)
Hemoglobin: 12.3 g/dL — ABNORMAL LOW (ref 13.0–17.0)
MCH: 30 pg (ref 26.0–34.0)
MCHC: 32.9 g/dL (ref 30.0–36.0)
MCV: 91.2 fL (ref 78.0–100.0)
Platelets: 374 10*3/uL (ref 150–400)
RBC: 4.1 MIL/uL — AB (ref 4.22–5.81)
RDW: 14.3 % (ref 11.5–15.5)
WBC: 5.9 10*3/uL (ref 4.0–10.5)

## 2015-06-30 LAB — SEDIMENTATION RATE: SED RATE: 11 mm/h (ref 0–16)

## 2015-06-30 MED ORDER — KETOROLAC TROMETHAMINE 30 MG/ML IJ SOLN
30.0000 mg | Freq: Once | INTRAMUSCULAR | Status: AC
  Administered 2015-06-30: 30 mg via INTRAVENOUS
  Filled 2015-06-30: qty 1

## 2015-06-30 MED ORDER — ONDANSETRON 4 MG PO TBDP
4.0000 mg | ORAL_TABLET | Freq: Once | ORAL | Status: AC
  Administered 2015-06-30: 4 mg via ORAL
  Filled 2015-06-30: qty 1

## 2015-06-30 MED ORDER — METOCLOPRAMIDE HCL 5 MG/ML IJ SOLN
10.0000 mg | Freq: Once | INTRAMUSCULAR | Status: AC
  Administered 2015-06-30: 10 mg via INTRAVENOUS
  Filled 2015-06-30: qty 2

## 2015-06-30 MED ORDER — ONDANSETRON HCL 4 MG PO TABS: 4.0000 mg | ORAL_TABLET | Freq: Four times a day (QID) | ORAL | Status: DC

## 2015-06-30 NOTE — Discharge Instructions (Signed)
Headaches, Frequently Asked Questions Return for fever, headache that does not resolve, vision changes, or neck pain.  MIGRAINE HEADACHES Q: What is migraine? What causes it? How can I treat it? A: Generally, migraine headaches begin as a dull ache. Then they develop into a constant, throbbing, and pulsating pain. You may experience pain at the temples. You may experience pain at the front or back of one or both sides of the head. The pain is usually accompanied by a combination of:  Nausea.  Vomiting.  Sensitivity to light and noise. Some people (about 15%) experience an aura (see below) before an attack. The cause of migraine is believed to be chemical reactions in the brain. Treatment for migraine may include over-the-counter or prescription medications. It may also include self-help techniques. These include relaxation training and biofeedback.  Q: What is an aura? A: About 15% of people with migraine get an "aura". This is a sign of neurological symptoms that occur before a migraine headache. You may see wavy or jagged lines, dots, or flashing lights. You might experience tunnel vision or blind spots in one or both eyes. The aura can include visual or auditory hallucinations (something imagined). It may include disruptions in smell (such as strange odors), taste or touch. Other symptoms include:  Numbness.  A "pins and needles" sensation.  Difficulty in recalling or speaking the correct word. These neurological events may last as long as 60 minutes. These symptoms will fade as the headache begins. Q: What is a trigger? A: Certain physical or environmental factors can lead to or "trigger" a migraine. These include:  Foods.  Hormonal changes.  Weather.  Stress. It is important to remember that triggers are different for everyone. To help prevent migraine attacks, you need to figure out which triggers affect you. Keep a headache diary. This is a good way to track triggers. The diary  will help you talk to your healthcare professional about your condition. Q: Does weather affect migraines? A: Bright sunshine, hot, humid conditions, and drastic changes in barometric pressure may lead to, or "trigger," a migraine attack in some people. But studies have shown that weather does not act as a trigger for everyone with migraines. Q: What is the link between migraine and hormones? A: Hormones start and regulate many of your body's functions. Hormones keep your body in balance within a constantly changing environment. The levels of hormones in your body are unbalanced at times. Examples are during menstruation, pregnancy, or menopause. That can lead to a migraine attack. In fact, about three quarters of all women with migraine report that their attacks are related to the menstrual cycle.  Q: Is there an increased risk of stroke for migraine sufferers? A: The likelihood of a migraine attack causing a stroke is very remote. That is not to say that migraine sufferers cannot have a stroke associated with their migraines. In persons under age 35, the most common associated factor for stroke is migraine headache. But over the course of a person's normal life span, the occurrence of migraine headache may actually be associated with a reduced risk of dying from cerebrovascular disease due to stroke.  Q: What are acute medications for migraine? A: Acute medications are used to treat the pain of the headache after it has started. Examples over-the-counter medications, NSAIDs, ergots, and triptans.  Q: What are the triptans? A: Triptans are the newest class of abortive medications. They are specifically targeted to treat migraine. Triptans are vasoconstrictors. They moderate some chemical  reactions in the brain. The triptans work on receptors in your brain. Triptans help to restore the balance of a neurotransmitter called serotonin. Fluctuations in levels of serotonin are thought to be a main cause of  migraine.  Q: Are over-the-counter medications for migraine effective? A: Over-the-counter, or "OTC," medications may be effective in relieving mild to moderate pain and associated symptoms of migraine. But you should see your caregiver before beginning any treatment regimen for migraine.  Q: What are preventive medications for migraine? A: Preventive medications for migraine are sometimes referred to as "prophylactic" treatments. They are used to reduce the frequency, severity, and length of migraine attacks. Examples of preventive medications include antiepileptic medications, antidepressants, beta-blockers, calcium channel blockers, and NSAIDs (nonsteroidal anti-inflammatory drugs). Q: Why are anticonvulsants used to treat migraine? A: During the past few years, there has been an increased interest in antiepileptic drugs for the prevention of migraine. They are sometimes referred to as "anticonvulsants". Both epilepsy and migraine may be caused by similar reactions in the brain.  Q: Why are antidepressants used to treat migraine? A: Antidepressants are typically used to treat people with depression. They may reduce migraine frequency by regulating chemical levels, such as serotonin, in the brain.  Q: What alternative therapies are used to treat migraine? A: The term "alternative therapies" is often used to describe treatments considered outside the scope of conventional Western medicine. Examples of alternative therapy include acupuncture, acupressure, and yoga. Another common alternative treatment is herbal therapy. Some herbs are believed to relieve headache pain. Always discuss alternative therapies with your caregiver before proceeding. Some herbal products contain arsenic and other toxins. TENSION HEADACHES Q: What is a tension-type headache? What causes it? How can I treat it? A: Tension-type headaches occur randomly. They are often the result of temporary stress, anxiety, fatigue, or anger.  Symptoms include soreness in your temples, a tightening band-like sensation around your head (a "vice-like" ache). Symptoms can also include a pulling feeling, pressure sensations, and contracting head and neck muscles. The headache begins in your forehead, temples, or the back of your head and neck. Treatment for tension-type headache may include over-the-counter or prescription medications. Treatment may also include self-help techniques such as relaxation training and biofeedback. CLUSTER HEADACHES Q: What is a cluster headache? What causes it? How can I treat it? A: Cluster headache gets its name because the attacks come in groups. The pain arrives with little, if any, warning. It is usually on one side of the head. A tearing or bloodshot eye and a runny nose on the same side of the headache may also accompany the pain. Cluster headaches are believed to be caused by chemical reactions in the brain. They have been described as the most severe and intense of any headache type. Treatment for cluster headache includes prescription medication and oxygen. SINUS HEADACHES Q: What is a sinus headache? What causes it? How can I treat it? A: When a cavity in the bones of the face and skull (a sinus) becomes inflamed, the inflammation will cause localized pain. This condition is usually the result of an allergic reaction, a tumor, or an infection. If your headache is caused by a sinus blockage, such as an infection, you will probably have a fever. An x-ray will confirm a sinus blockage. Your caregiver's treatment might include antibiotics for the infection, as well as antihistamines or decongestants.  REBOUND HEADACHES Q: What is a rebound headache? What causes it? How can I treat it? A: A pattern of taking  acute headache medications too often can lead to a condition known as "rebound headache." A pattern of taking too much headache medication includes taking it more than 2 days per week or in excessive amounts.  That means more than the label or a caregiver advises. With rebound headaches, your medications not only stop relieving pain, they actually begin to cause headaches. Doctors treat rebound headache by tapering the medication that is being overused. Sometimes your caregiver will gradually substitute a different type of treatment or medication. Stopping may be a challenge. Regularly overusing a medication increases the potential for serious side effects. Consult a caregiver if you regularly use headache medications more than 2 days per week or more than the label advises. ADDITIONAL QUESTIONS AND ANSWERS Q: What is biofeedback? A: Biofeedback is a self-help treatment. Biofeedback uses special equipment to monitor your body's involuntary physical responses. Biofeedback monitors:  Breathing.  Pulse.  Heart rate.  Temperature.  Muscle tension.  Brain activity. Biofeedback helps you refine and perfect your relaxation exercises. You learn to control the physical responses that are related to stress. Once the technique has been mastered, you do not need the equipment any more. Q: Are headaches hereditary? A: Four out of five (80%) of people that suffer report a family history of migraine. Scientists are not sure if this is genetic or a family predisposition. Despite the uncertainty, a child has a 50% chance of having migraine if one parent suffers. The child has a 75% chance if both parents suffer.  Q: Can children get headaches? A: By the time they reach high school, most young people have experienced some type of headache. Many safe and effective approaches or medications can prevent a headache from occurring or stop it after it has begun.  Q: What type of doctor should I see to diagnose and treat my headache? A: Start with your primary caregiver. Discuss his or her experience and approach to headaches. Discuss methods of classification, diagnosis, and treatment. Your caregiver may decide to recommend  you to a headache specialist, depending upon your symptoms or other physical conditions. Having diabetes, allergies, etc., may require a more comprehensive and inclusive approach to your headache. The National [employer] Foundation will provide, upon request, a list of [facility] physician members in your state. Document Released: 03/06/2004 Document Revised: 03/08/2012 Document Reviewed: 08/14/2008 [facility] Patient Information 2015 [place], [place]. This information is not intended to replace advice given to you by your health care provider. Make sure you discuss any questions you have with your health care provider.

## 2015-06-30 NOTE — ED Notes (Signed)
Pt arrived to the ED with a complaint of a headache that has lasted a week.  Pt states the pain is located in the back and top of his head.  Pt states he has been off balance as well.  Pt is hypertensive in triage but has been compliant with his medications.
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
## 2015-07-03 ENCOUNTER — Ambulatory Visit (INDEPENDENT_AMBULATORY_CARE_PROVIDER_SITE_OTHER): Payer: Medicare Other | Admitting: Internal Medicine

## 2015-07-03 ENCOUNTER — Encounter: Payer: Self-pay | Admitting: Internal Medicine

## 2015-07-03 VITALS — BP 162/90 | HR 86 | Temp 98.6°F | Ht 65.0 in | Wt 158.0 lb

## 2015-07-03 DIAGNOSIS — I10 Essential (primary) hypertension: Secondary | ICD-10-CM

## 2015-07-03 MED ORDER — AMLODIPINE BESYLATE 10 MG PO TABS: 10.0000 mg | ORAL_TABLET | Freq: Every day | ORAL | Status: DC

## 2015-07-03 MED ORDER — LISINOPRIL 40 MG PO TABS: 40.0000 mg | ORAL_TABLET | Freq: Every day | ORAL | Status: DC
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
## 2015-07-03 NOTE — Patient Instructions (Signed)
Ok to increase the amlodipine to 10 mg per day  Ok to increase the lisinopril to 40 mg per day  Please continue all other medications as before, and refills have been done if requested.  Please have the pharmacy call with any other refills you may need.  Please keep your appointments with your specialists as you may have planned  Please continue to monitor your blood pressure on a regular basis, as your goal is to be less than 140/90

## 2015-07-03 NOTE — Progress Notes (Signed)
Pre visit review using our clinic review tool, if applicable. No additional management support is needed unless otherwise documented below in the visit note. 

## 2015-07-05 NOTE — Assessment & Plan Note (Signed)
Uncontrolled, to incr the amlod to 10 qd, and lisiopril 40 qd, with f/u BP at home and next visit,  to f/u any worsening symptoms or concerns

## 2015-07-18 DIAGNOSIS — H4011X3 Primary open-angle glaucoma, severe stage: Secondary | ICD-10-CM | POA: Diagnosis not present

## 2015-08-03 DIAGNOSIS — C61 Malignant neoplasm of prostate: Secondary | ICD-10-CM | POA: Diagnosis not present

## 2015-08-10 DIAGNOSIS — Z8546 Personal history of malignant neoplasm of prostate: Secondary | ICD-10-CM | POA: Diagnosis not present

## 2015-10-24 ENCOUNTER — Ambulatory Visit (INDEPENDENT_AMBULATORY_CARE_PROVIDER_SITE_OTHER): Payer: Medicare Other | Admitting: Internal Medicine

## 2015-10-24 ENCOUNTER — Encounter: Payer: Self-pay | Admitting: Internal Medicine

## 2015-10-24 VITALS — BP 132/78 | HR 71 | Temp 98.0°F | Wt 158.1 lb

## 2015-10-24 DIAGNOSIS — Z23 Encounter for immunization: Secondary | ICD-10-CM | POA: Diagnosis not present

## 2015-10-24 DIAGNOSIS — I10 Essential (primary) hypertension: Secondary | ICD-10-CM

## 2015-10-24 DIAGNOSIS — E785 Hyperlipidemia, unspecified: Secondary | ICD-10-CM | POA: Diagnosis not present

## 2015-10-24 DIAGNOSIS — F1010 Alcohol abuse, uncomplicated: Secondary | ICD-10-CM
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
## 2015-10-24 NOTE — Assessment & Plan Note (Signed)
Resolved, none recent, cont to monitor/abstain

## 2015-10-24 NOTE — Assessment & Plan Note (Signed)
stable overall by history and exam, recent data reviewed with pt, and pt to continue medical treatment as before,  to f/u any worsening symptoms or concerns Lab Results  Component Value Date   LDLCALC 107* 04/24/2015

## 2015-10-24 NOTE — Assessment & Plan Note (Signed)
stable overall by history and exam, recent data reviewed with pt, and pt to continue medical treatment as before,  to f/u any worsening symptoms or concerns stable overall by history and exam, recent data reviewed with pt, and pt to continue medical treatment as before,  to f/u any worsening symptoms or concerns BP Readings from Last 3 Encounters:  10/24/15 132/78  07/03/15 162/90  06/30/15 164/83

## 2015-10-24 NOTE — Patient Instructions (Addendum)
You had the flu shot today  Please continue all other medications as before, and refills have been done if requested.  Please have the pharmacy call with any other refills you may need.  Please continue your efforts at being more active, low cholesterol diet, and weight control.  You are otherwise up to date with prevention measures today.  Please keep your appointments with your specialists as you may have planned  No further lab work is needed at this time  Please return in 6 months, or sooner if needed

## 2015-11-14 ENCOUNTER — Telehealth: Payer: Self-pay | Admitting: Internal Medicine

## 2015-11-14 MED ORDER — ATORVASTATIN CALCIUM 10 MG PO TABS: 10.0000 mg | ORAL_TABLET | Freq: Every day | ORAL | Status: DC

## 2015-11-14 NOTE — Telephone Encounter (Signed)
Patient requesting refill for atorvastatin (LIPITOR) 10 MG tablet [id number] Pharmacy is [facility] on [address]

## 2015-11-27 DIAGNOSIS — H401133 Primary open-angle glaucoma, bilateral, severe stage: Secondary | ICD-10-CM | POA: Diagnosis not present

## 2015-11-27 DIAGNOSIS — H2512 Age-related nuclear cataract, left eye: Secondary | ICD-10-CM | POA: Diagnosis not present

## 2016-03-06 DIAGNOSIS — H401132 Primary open-angle glaucoma, bilateral, moderate stage: Secondary | ICD-10-CM | POA: Diagnosis not present

## 2016-04-23 ENCOUNTER — Ambulatory Visit (INDEPENDENT_AMBULATORY_CARE_PROVIDER_SITE_OTHER): Payer: Medicare Other | Admitting: Internal Medicine

## 2016-04-23 ENCOUNTER — Other Ambulatory Visit (INDEPENDENT_AMBULATORY_CARE_PROVIDER_SITE_OTHER): Payer: Medicare Other

## 2016-04-23 ENCOUNTER — Encounter: Payer: Self-pay | Admitting: Internal Medicine

## 2016-04-23 VITALS — BP 140/80 | HR 82 | Temp 98.4°F | Resp 20 | Wt 156.0 lb

## 2016-04-23 DIAGNOSIS — I10 Essential (primary) hypertension: Secondary | ICD-10-CM | POA: Diagnosis not present

## 2016-04-23 DIAGNOSIS — N400 Enlarged prostate without lower urinary tract symptoms: Secondary | ICD-10-CM | POA: Diagnosis not present

## 2016-04-23 DIAGNOSIS — E785 Hyperlipidemia, unspecified: Secondary | ICD-10-CM

## 2016-04-23 DIAGNOSIS — C61 Malignant neoplasm of prostate: Secondary | ICD-10-CM

## 2016-04-23 DIAGNOSIS — K219 Gastro-esophageal reflux disease without esophagitis: Secondary | ICD-10-CM

## 2016-04-23 DIAGNOSIS — Z86010 Personal history of colonic polyps: Secondary | ICD-10-CM

## 2016-04-23 DIAGNOSIS — Z1159 Encounter for screening for other viral diseases: Secondary | ICD-10-CM | POA: Diagnosis not present

## 2016-04-23 HISTORY — DX: Gastro-esophageal reflux disease without esophagitis: K21.9

## 2016-04-23 LAB — CBC WITH DIFFERENTIAL/PLATELET
Basophils Absolute: 0 10*3/uL (ref 0.0–0.1)
Basophils Relative: 0.6 % (ref 0.0–3.0)
EOS PCT: 2.8 % (ref 0.0–5.0)
Eosinophils Absolute: 0.2 10*3/uL (ref 0.0–0.7)
HCT: 36.5 % — ABNORMAL LOW (ref 39.0–52.0)
Hemoglobin: 12.3 g/dL — ABNORMAL LOW (ref 13.0–17.0)
Lymphocytes Relative: 22.7 % (ref 12.0–46.0)
Lymphs Abs: 1.4 10*3/uL (ref 0.7–4.0)
MCHC: 33.5 g/dL (ref 30.0–36.0)
MCV: 92 fl (ref 78.0–100.0)
Monocytes Absolute: 0.5 10*3/uL (ref 0.1–1.0)
Monocytes Relative: 8.4 % (ref 3.0–12.0)
NEUTROS PCT: 65.5 % (ref 43.0–77.0)
Neutro Abs: 4.1 10*3/uL (ref 1.4–7.7)
Platelets: 377 10*3/uL (ref 150.0–400.0)
RBC: 3.97 Mil/uL — AB (ref 4.22–5.81)
RDW: 15.8 % — AB (ref 11.5–15.5)
WBC: 6.3 10*3/uL (ref 4.0–10.5)

## 2016-04-23 LAB — BASIC METABOLIC PANEL
BUN: 10 mg/dL (ref 6–23)
CO2: 28 meq/L (ref 19–32)
Calcium: 9.4 mg/dL (ref 8.4–10.5)
Chloride: 109 mEq/L (ref 96–112)
Creatinine, Ser: 0.77 mg/dL (ref 0.40–1.50)
GFR: 127.96 mL/min (ref >60.00)
GLUCOSE: 92 mg/dL (ref 70–99)
Potassium: 3.5 mEq/L (ref 3.5–5.1)
SODIUM: 145 meq/L (ref 135–145)

## 2016-04-23 LAB — LIPID PANEL
CHOLESTEROL: 177 mg/dL (ref 0–200)
HDL: 57.7 mg/dL (ref >39.00)
LDL Cholesterol: 106 mg/dL — ABNORMAL HIGH (ref 0–99)
NonHDL: 119.06
Total CHOL/HDL Ratio: 3
Triglycerides: 65 mg/dL (ref 0.0–149.0)
VLDL: 13 mg/dL (ref 0.0–40.0)

## 2016-04-23 LAB — HEPATIC FUNCTION PANEL
ALBUMIN: 4.3 g/dL (ref 3.5–5.2)
ALT: 11 U/L (ref 0–53)
AST: 12 U/L (ref 0–37)
Alkaline Phosphatase: 77 U/L (ref 39–117)
Bilirubin, Direct: 0.1 mg/dL (ref 0.0–0.3)
TOTAL PROTEIN: 6.8 g/dL (ref 6.0–8.3)
Total Bilirubin: 0.3 mg/dL (ref 0.2–1.2)

## 2016-04-23 LAB — URINALYSIS, ROUTINE W REFLEX MICROSCOPIC
BILIRUBIN URINE: NEGATIVE
KETONES UR: NEGATIVE
LEUKOCYTES UA: NEGATIVE
Nitrite: NEGATIVE
Specific Gravity, Urine: 1.015 (ref 1.000–1.030)
Total Protein, Urine: NEGATIVE
Urine Glucose: NEGATIVE
Urobilinogen, UA: 0.2 (ref 0.0–1.0)
pH: 6 (ref 5.0–8.0)

## 2016-04-23 LAB — TSH: TSH: 0.95 u[IU]/mL (ref 0.35–4.50)

## 2016-04-23 LAB — PSA: PSA: 0.24 ng/mL (ref 0.10–4.00)

## 2016-04-23 MED ORDER — TAMSULOSIN HCL 0.4 MG PO CAPS: 0.4000 mg | ORAL_CAPSULE | Freq: Every day | ORAL | Status: DC

## 2016-04-23 MED ORDER — PANTOPRAZOLE SODIUM 40 MG PO TBEC: 40.0000 mg | DELAYED_RELEASE_TABLET | Freq: Every day | ORAL | Status: DC

## 2016-04-23 NOTE — Progress Notes (Signed)
Pre visit review using our clinic review tool, if applicable. No additional management support is needed unless otherwise documented below in the visit note. 

## 2016-04-23 NOTE — Patient Instructions (Addendum)
OK to change the Pepcid to Protonix (generic) for acid reflux  Please continue all other medications as before, and refills have been done if requested, including the flomax for the prostate  Please have the pharmacy call with any other refills you may need.  Please continue your efforts at being more active, low cholesterol diet, and weight control.  You are otherwise up to date with prevention measures today.  Please keep your appointments with your specialists as you may have planned  You will be contacted regarding the referral for: colonoscopy  Please go to the LAB in the Basement (turn left off the elevator) for the tests to be done today  You will be contacted by phone if any changes need to be made immediately.  Otherwise, you will receive a letter about your results with an explanation, but please check with MyChart first.  Please remember to sign up for MyChart if you have not done so, as this will be important to you in the future with finding out test results, communicating by private email, and scheduling acute appointments online when needed.  Please return in 6 months, or sooner if needed
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
## 2016-04-24 ENCOUNTER — Encounter: Payer: Self-pay | Admitting: Gastroenterology

## 2016-04-24 ENCOUNTER — Encounter: Payer: Self-pay | Admitting: Internal Medicine

## 2016-04-24 LAB — HEPATITIS C ANTIBODY: HCV Ab: NEGATIVE

## 2016-04-29 NOTE — Assessment & Plan Note (Signed)
Mild to mod, for change pepcid to protonix 40 qd,  to f/u any worsening symptoms or concerns

## 2016-04-29 NOTE — Assessment & Plan Note (Signed)
stable overall by history and exam, recent data reviewed with pt, and pt to continue medical treatment as before,  to f/u any worsening symptoms or concerns BP Readings from Last 3 Encounters:  04/23/16 140/80  10/24/15 132/78  07/03/15 162/90

## 2016-04-29 NOTE — Assessment & Plan Note (Signed)
stable overall by history and exam, recent data reviewed with pt, and pt to continue medical treatment as before,  to f/u any worsening symptoms or concerns Lab Results  Component Value Date   LDLCALC 106* 04/23/2016

## 2016-04-29 NOTE — Assessment & Plan Note (Signed)
Mild symptomatic, for flomax restart, f/u urology for worsening s/s

## 2016-04-29 NOTE — Assessment & Plan Note (Signed)
For psa per pt request, f/u urology as planned

## 2016-05-13 ENCOUNTER — Ambulatory Visit (AMBULATORY_SURGERY_CENTER): Payer: Self-pay

## 2016-05-13 VITALS — Ht 65.0 in | Wt 157.6 lb

## 2016-05-13 DIAGNOSIS — Z86010 Personal history of colonic polyps: Secondary | ICD-10-CM

## 2016-05-13 MED ORDER — NA SULFATE-K SULFATE-MG SULF 17.5-3.13-1.6 GM/177ML PO SOLN: ORAL | Status: DC

## 2016-05-13 NOTE — Progress Notes (Signed)
Per pt, no allergies to soy or egg products.Pt not taking any weight loss meds or using  O2 at home. 

## 2016-05-27 ENCOUNTER — Ambulatory Visit (AMBULATORY_SURGERY_CENTER): Payer: Medicare Other | Admitting: Gastroenterology

## 2016-05-27 ENCOUNTER — Encounter: Payer: Self-pay | Admitting: Gastroenterology

## 2016-05-27 VITALS — BP 140/81 | HR 65 | Temp 98.4°F | Resp 17 | Ht 65.0 in | Wt 157.0 lb

## 2016-05-27 DIAGNOSIS — D122 Benign neoplasm of ascending colon: Secondary | ICD-10-CM

## 2016-05-27 DIAGNOSIS — Z86010 Personal history of colonic polyps: Secondary | ICD-10-CM | POA: Diagnosis not present

## 2016-05-27 DIAGNOSIS — D649 Anemia, unspecified: Secondary | ICD-10-CM | POA: Diagnosis not present

## 2016-05-27 DIAGNOSIS — D123 Benign neoplasm of transverse colon: Secondary | ICD-10-CM

## 2016-05-27 DIAGNOSIS — K635 Polyp of colon: Secondary | ICD-10-CM | POA: Diagnosis not present

## 2016-05-27 DIAGNOSIS — N400 Enlarged prostate without lower urinary tract symptoms: Secondary | ICD-10-CM | POA: Diagnosis not present

## 2016-05-27 DIAGNOSIS — Z1211 Encounter for screening for malignant neoplasm of colon: Secondary | ICD-10-CM | POA: Diagnosis not present

## 2016-05-27 MED ORDER — SODIUM CHLORIDE 0.9 % IV SOLN: 500.0000 mL | INTRAVENOUS | Status: DC

## 2016-05-27 NOTE — Op Note (Signed)
[facility] Patient Name: Brian Allen Procedure Date: 05/27/2016 11:02 AM MRN: [id number] Endoscopist: Remo Lipps P. Havery Moros , MD Age: 70 Referring MD:  Date of Birth: [birth year] Gender: Male Procedure:                Colonoscopy Indications:              Surveillance: Personal history of adenomatous                            polyps on last colonoscopy 5 years ago Medicines:                Monitored Anesthesia Care Procedure:                Pre-Anesthesia Assessment:                           - Prior to the procedure, a History and Physical                            was performed, and patient medications and                            allergies were reviewed. The patient's tolerance of                            previous anesthesia was also reviewed. The risks                            and benefits of the procedure and the sedation                            options and risks were discussed with the patient.                            All questions were answered, and informed consent                            was obtained. Prior Anticoagulants: The patient has                            taken aspirin, last dose was 1 day prior to                            procedure. ASA Grade Assessment: III - A patient                            with severe systemic disease. After reviewing the                            risks and benefits, the patient was deemed in                            satisfactory condition to undergo the procedure.  After obtaining informed consent, the colonoscope                            was passed under direct vision. Throughout the                            procedure, the patient's blood pressure, pulse, and                            oxygen saturations were monitored continuously. The                            Model CF-[id number]) scope was introduced                            through the anus and advanced to the  the cecum,                            identified by appendiceal orifice and ileocecal                            valve. The colonoscopy was performed without                            difficulty. The patient tolerated the procedure                            well. The quality of the bowel preparation was                            good. The ileocecal valve, appendiceal orifice, and                            rectum were photographed. Scope In: [id number] Scope Out: 11:27:38 AM Scope Withdrawal Time: 0 hours 14 minutes 38 seconds  Total Procedure Duration: 0 hours 20 minutes 56 seconds  Findings:                 The perianal and digital rectal examinations were                            normal.                           Multiple small-mouthed diverticula were found in                            the sigmoid colon.                           A 4 mm polyp was found in the ascending colon. The                            polyp was sessile. The polyp was removed with a  cold snare. Resection and retrieval were complete.                           Five sessile polyps were found in the transverse                            colon. The polyps were 3 to 6 mm in size. These                            polyps were removed with a cold snare. Resection                            and retrieval were complete.                           There was a medium-sized lipoma, at the hepatic                            flexure.                           Non-bleeding internal hemorrhoids were found during                            retroflexion.                           The exam was otherwise without abnormality. Complications:            No immediate complications. Estimated blood loss:                            Minimal. Estimated Blood Loss:     Estimated blood loss was minimal. Impression:               - Diverticulosis in the sigmoid colon with                            associated  restrictued mobility of the colon and                            narrowed lumen in this area.                           - One 4 mm polyp in the ascending colon, removed                            with a cold snare. Resected and retrieved.                           - Five 3 to 6 mm polyps in the transverse colon,                            removed with a cold snare. Resected and retrieved.                           -  Medium-sized lipoma at the hepatic flexure.                           - Non-bleeding internal hemorrhoids.                           - The examination was otherwise normal. Recommendation:           - Patient has a contact number available for                            emergencies. The signs and symptoms of potential                            delayed complications were discussed with the                            patient. Return to normal activities tomorrow.                            Written discharge instructions were provided to the                            patient.                           - Resume previous diet.                           - Continue present medications.                           - No ibuprofen, naproxen, or other non-steroidal                            anti-inflammatory drugs for 2 weeks after polyp                            removal.                           - Await pathology results.                           - Repeat colonoscopy is recommended for                            surveillance. The colonoscopy date will be                            determined after pathology results from today's                            exam become available for review. Remo Lipps P. Murline Weigel, MD 05/27/2016 11:32:57 AM This report has been signed electronically.

## 2016-05-27 NOTE — Progress Notes (Signed)
Stable to RR 

## 2016-05-27 NOTE — Progress Notes (Signed)
Called to room to assist during endoscopic procedure.  Patient ID and intended procedure confirmed with present staff. Received instructions for my participation in the procedure from the performing physician.  

## 2016-05-27 NOTE — Patient Instructions (Signed)
YOU HAD AN ENDOSCOPIC PROCEDURE TODAY AT [facility]:   Refer to the procedure report that was given to you for any specific questions about what was found during the examination.  If the procedure report does not answer your questions, please call your gastroenterologist to clarify.  If you requested that your care partner not be given the details of your procedure findings, then the procedure report has been included in a sealed envelope for you to review at your convenience later.  YOU SHOULD EXPECT: Some feelings of bloating in the abdomen. Passage of more gas than usual.  Walking can help get rid of the air that was put into your GI tract during the procedure and reduce the bloating. If you had a lower endoscopy (such as a colonoscopy or flexible sigmoidoscopy) you may notice spotting of blood in your stool or on the toilet paper. If you underwent a bowel prep for your procedure, you may not have a normal bowel movement for a few days.  Please Note:  You might notice some irritation and congestion in your nose or some drainage.  This is from the oxygen used during your procedure.  There is no need for concern and it should clear up in a day or so.  SYMPTOMS TO REPORT IMMEDIATELY:   Following lower endoscopy (colonoscopy or flexible sigmoidoscopy):  Excessive amounts of blood in the stool  Significant tenderness or worsening of abdominal pains  Swelling of the abdomen that is new, acute  Fever of 100F or higher  For urgent or emergent issues, a gastroenterologist can be reached at any hour by calling [phone].   DIET: Your first meal following the procedure should be a small meal and then it is ok to progress to your normal diet. Heavy or fried foods are harder to digest and may make you feel nauseous or bloated.  Likewise, meals heavy in dairy and vegetables can increase bloating.  Drink plenty of fluids but you should avoid alcoholic beverages for 24  hours.  ACTIVITY:  You should plan to take it easy for the rest of today and you should NOT DRIVE or use heavy machinery until tomorrow (because of the sedation medicines used during the test).    FOLLOW UP: Our staff will call the number listed on your records the next business day following your procedure to check on you and address any questions or concerns that you may have regarding the information given to you following your procedure. If we do not reach you, we will leave a message.  However, if you are feeling well and you are not experiencing any problems, there is no need to return our call.  We will assume that you have returned to your regular daily activities without incident.  If any biopsies were taken you will be contacted by phone or by letter within the next 1-3 weeks.  Please call us at [phone] if you have not heard about the biopsies in 3 weeks.    SIGNATURES/CONFIDENTIALITY: You and/or your care partner have signed paperwork which will be entered into your electronic medical record.  These signatures attest to the fact that that the information above on your After Visit Summary has been reviewed and is understood.  Full responsibility of the confidentiality of this discharge information lies with you and/or your care-partner.  No aspirin, ibuprofen, naproxen, aleve, or other non-steroidal anti-inflammatory drugs for 2 weeks after polyp removal. Please review polyp, diverticulosis, and high fiber handouts provided.

## 2016-05-28 ENCOUNTER — Telehealth: Payer: Self-pay

## 2016-05-28 NOTE — Telephone Encounter (Signed)
  Follow up Call-  Call back number 05/27/2016  Post procedure Call Back phone  # [phone]  Permission to leave phone message Yes     Patient questions:  Do you have a fever, pain , or abdominal swelling? No. Pain Score  0 *  Have you tolerated food without any problems? Yes.    Have you been able to return to your normal activities? Yes.    Do you have any questions about your discharge instructions: Diet   No. Medications  No. Follow up visit  No.  Do you have questions or concerns about your Care? No.  Actions: * If pain score is 4 or above: No action needed, pain <4.

## 2016-06-02 ENCOUNTER — Encounter: Payer: Self-pay | Admitting: Gastroenterology

## 2016-07-27 ENCOUNTER — Other Ambulatory Visit: Payer: Self-pay | Admitting: Internal Medicine

## 2016-08-26 ENCOUNTER — Other Ambulatory Visit: Payer: Self-pay | Admitting: Internal Medicine

## 2016-10-23 ENCOUNTER — Ambulatory Visit: Payer: Medicare Other | Admitting: Internal Medicine

## 2016-10-29 ENCOUNTER — Ambulatory Visit (INDEPENDENT_AMBULATORY_CARE_PROVIDER_SITE_OTHER): Payer: Medicare Other | Admitting: Internal Medicine

## 2016-10-29 ENCOUNTER — Encounter: Payer: Self-pay | Admitting: Internal Medicine

## 2016-10-29 VITALS — BP 140/80 | HR 74 | Temp 98.1°F | Resp 20 | Wt 156.8 lb

## 2016-10-29 DIAGNOSIS — N401 Enlarged prostate with lower urinary tract symptoms: Secondary | ICD-10-CM | POA: Diagnosis not present

## 2016-10-29 DIAGNOSIS — R0789 Other chest pain: Secondary | ICD-10-CM | POA: Diagnosis not present

## 2016-10-29 DIAGNOSIS — I10 Essential (primary) hypertension: Secondary | ICD-10-CM

## 2016-10-29 MED ORDER — TAMSULOSIN HCL 0.4 MG PO CAPS
0.4000 mg | ORAL_CAPSULE | Freq: Every day | ORAL | 3 refills | Status: DC
Start: 2016-10-29 — End: 2017-05-21

## 2016-10-29 NOTE — Patient Instructions (Signed)
You had the flu shot today  Please continue all other medications as before, and refills have been done if requested.  Please have the pharmacy call with any other refills you may need.  Please continue your efforts at being more active, low cholesterol diet, and weight control.  Please keep your appointments with your specialists as you may have planned  Please return in 6 months, or sooner if needed 

## 2016-10-29 NOTE — Progress Notes (Signed)
Pre visit review using our clinic review tool, if applicable. No additional management support is needed unless otherwise documented below in the visit note. 
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
## 2016-11-02 ENCOUNTER — Other Ambulatory Visit: Payer: Self-pay | Admitting: Internal Medicine

## 2016-11-03 NOTE — Assessment & Plan Note (Signed)
C/w msk soft tissue contusion, d/w pt, declines rib xray, for pain control,  to f/u any worsening symptoms or concerns

## 2016-11-03 NOTE — Assessment & Plan Note (Signed)
stable overall by history and exam, recent data reviewed with pt, and pt to continue medical treatment as before,  to f/u any worsening symptoms or concerns BP Readings from Last 3 Encounters:  10/29/16 140/80  05/27/16 140/81  04/23/16 140/80

## 2016-11-03 NOTE — Assessment & Plan Note (Signed)
With mild worsening symptoms, to restart the flomax,  to f/u any worsening symptoms or concerns

## 2016-11-08 ENCOUNTER — Emergency Department (HOSPITAL_COMMUNITY)
Admission: EM | Admit: 2016-11-08 | Discharge: 2016-11-08 | Disposition: A | Discharge status: Home / Self Care | Payer: Medicare Other | Attending: Emergency Medicine | Admitting: Emergency Medicine

## 2016-11-08 ENCOUNTER — Encounter (HOSPITAL_COMMUNITY): Payer: Self-pay | Admitting: Emergency Medicine

## 2016-11-08 DIAGNOSIS — Z8546 Personal history of malignant neoplasm of prostate: Secondary | ICD-10-CM | POA: Insufficient documentation

## 2016-11-08 DIAGNOSIS — Z79899 Other long term (current) drug therapy: Secondary | ICD-10-CM | POA: Diagnosis not present

## 2016-11-08 DIAGNOSIS — I10 Essential (primary) hypertension: Secondary | ICD-10-CM | POA: Insufficient documentation

## 2016-11-08 DIAGNOSIS — Z7982 Long term (current) use of aspirin: Secondary | ICD-10-CM | POA: Insufficient documentation

## 2016-11-08 DIAGNOSIS — R040 Epistaxis: Secondary | ICD-10-CM | POA: Insufficient documentation

## 2016-11-08 MED ORDER — OXYCODONE-ACETAMINOPHEN 5-325 MG PO TABS
1.0000 | ORAL_TABLET | Freq: Once | ORAL | Status: AC
  Administered 2016-11-08: 1 via ORAL
  Filled 2016-11-08: qty 1

## 2016-11-08 MED ORDER — SULFAMETHOXAZOLE-TRIMETHOPRIM 800-160 MG PO TABS
1.0000 | ORAL_TABLET | Freq: Once | ORAL | Status: AC
  Administered 2016-11-08: 1 via ORAL
  Filled 2016-11-08: qty 1

## 2016-11-08 MED ORDER — SULFAMETHOXAZOLE-TRIMETHOPRIM 800-160 MG PO TABS: 1.0000 | ORAL_TABLET | Freq: Two times a day (BID) | ORAL | 0 refills | Status: AC

## 2016-11-08 MED ORDER — OXYCODONE-ACETAMINOPHEN 5-325 MG PO TABS: 1.0000 | ORAL_TABLET | ORAL | 0 refills | Status: DC | PRN
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
## 2016-11-08 NOTE — ED Triage Notes (Signed)
Per EMS pt. From home with complaint of epistaxis which started at 0345 this morning upon waking up, pt. Has hypertension ,taking his medicine regularly, claimed of lightheadedness. Not on blood thinner. No SOB. Afrin spray given via EMS.

## 2016-11-08 NOTE — ED Notes (Signed)
Bed: [id number] Expected date:  Expected time:  Means of arrival:  Comments: 70 yo M/ epistaxis

## 2016-11-08 NOTE — Discharge Instructions (Signed)
Keep the Rapid Rhino in place until you see the ENT physician in 3-5 days. Return if bleeding starts again.

## 2016-11-08 NOTE — ED Notes (Signed)
ED Provider at bedside. 

## 2016-11-13 DIAGNOSIS — R040 Epistaxis: Secondary | ICD-10-CM | POA: Diagnosis not present

## 2016-11-25 ENCOUNTER — Telehealth: Payer: Self-pay | Admitting: Internal Medicine

## 2016-11-25 NOTE — Telephone Encounter (Signed)
Called Brian Allen to scheduled awv appt. Patient did not answer the phone. Will try to call patient back to schedule appt.

## 2017-02-10 DIAGNOSIS — C61 Malignant neoplasm of prostate: Secondary | ICD-10-CM | POA: Diagnosis not present

## 2017-02-10 DIAGNOSIS — N3281 Overactive bladder: Secondary | ICD-10-CM | POA: Diagnosis not present

## 2017-03-03 ENCOUNTER — Other Ambulatory Visit: Payer: Self-pay | Admitting: Internal Medicine

## 2017-04-28 ENCOUNTER — Ambulatory Visit: Payer: Medicare Other | Admitting: Internal Medicine

## 2017-05-07 ENCOUNTER — Other Ambulatory Visit: Payer: Self-pay | Admitting: Internal Medicine

## 2017-05-21 ENCOUNTER — Other Ambulatory Visit (INDEPENDENT_AMBULATORY_CARE_PROVIDER_SITE_OTHER): Payer: Medicare Other

## 2017-05-21 ENCOUNTER — Encounter: Payer: Self-pay | Admitting: Internal Medicine

## 2017-05-21 ENCOUNTER — Ambulatory Visit (INDEPENDENT_AMBULATORY_CARE_PROVIDER_SITE_OTHER): Payer: Medicare Other | Admitting: Internal Medicine

## 2017-05-21 VITALS — BP 200/110 | HR 83 | Ht 65.0 in | Wt 152.0 lb

## 2017-05-21 DIAGNOSIS — Z0000 Encounter for general adult medical examination without abnormal findings: Secondary | ICD-10-CM

## 2017-05-21 DIAGNOSIS — I10 Essential (primary) hypertension: Secondary | ICD-10-CM

## 2017-05-21 LAB — URINALYSIS, ROUTINE W REFLEX MICROSCOPIC
BILIRUBIN URINE: NEGATIVE
HGB URINE DIPSTICK: NEGATIVE
Ketones, ur: NEGATIVE
LEUKOCYTES UA: NEGATIVE
Nitrite: NEGATIVE
RBC / HPF: NONE SEEN (ref 0–?)
Specific Gravity, Urine: <=1.005 — AB (ref 1.000–1.030)
Total Protein, Urine: NEGATIVE
URINE GLUCOSE: NEGATIVE
UROBILINOGEN UA: 0.2 (ref 0.0–1.0)
WBC, UA: NONE SEEN (ref 0–?)
pH: 7 (ref 5.0–8.0)

## 2017-05-21 LAB — TSH: TSH: 1.19 u[IU]/mL (ref 0.35–4.50)

## 2017-05-21 LAB — CBC WITH DIFFERENTIAL/PLATELET
Basophils Absolute: 0.1 10*3/uL (ref 0.0–0.1)
Basophils Relative: 1.2 % (ref 0.0–3.0)
EOS PCT: 2.9 % (ref 0.0–5.0)
Eosinophils Absolute: 0.2 10*3/uL (ref 0.0–0.7)
HCT: 37.7 % — ABNORMAL LOW (ref 39.0–52.0)
Hemoglobin: 12.6 g/dL — ABNORMAL LOW (ref 13.0–17.0)
Lymphocytes Relative: 21.9 % (ref 12.0–46.0)
Lymphs Abs: 1.5 10*3/uL (ref 0.7–4.0)
MCHC: 33.4 g/dL (ref 30.0–36.0)
MCV: 91.7 fl (ref 78.0–100.0)
MONOS PCT: 7.2 % (ref 3.0–12.0)
Monocytes Absolute: 0.5 10*3/uL (ref 0.1–1.0)
Neutro Abs: 4.4 10*3/uL (ref 1.4–7.7)
Neutrophils Relative %: 66.8 % (ref 43.0–77.0)
Platelets: 353 10*3/uL (ref 150.0–400.0)
RBC: 4.12 Mil/uL — AB (ref 4.22–5.81)
RDW: 14.9 % (ref 11.5–15.5)
WBC: 6.7 10*3/uL (ref 4.0–10.5)

## 2017-05-21 LAB — LIPID PANEL
CHOLESTEROL: 193 mg/dL (ref 0–200)
HDL: 68.3 mg/dL (ref >39.00)
LDL CALC: 111 mg/dL — AB (ref 0–99)
NonHDL: 125.06
TRIGLYCERIDES: 71 mg/dL (ref 0.0–149.0)
Total CHOL/HDL Ratio: 3
VLDL: 14.2 mg/dL (ref 0.0–40.0)

## 2017-05-21 LAB — BASIC METABOLIC PANEL
BUN: 13 mg/dL (ref 6–23)
CALCIUM: 9.7 mg/dL (ref 8.4–10.5)
CO2: 29 mEq/L (ref 19–32)
CREATININE: 0.94 mg/dL (ref 0.40–1.50)
Chloride: 105 mEq/L (ref 96–112)
GFR: 101.34 mL/min (ref >60.00)
GLUCOSE: 83 mg/dL (ref 70–99)
Potassium: 3.8 mEq/L (ref 3.5–5.1)
SODIUM: 142 meq/L (ref 135–145)

## 2017-05-21 LAB — HEPATIC FUNCTION PANEL
ALBUMIN: 4.3 g/dL (ref 3.5–5.2)
ALT: 14 U/L (ref 0–53)
AST: 15 U/L (ref 0–37)
Alkaline Phosphatase: 75 U/L (ref 39–117)
Bilirubin, Direct: 0.1 mg/dL (ref 0.0–0.3)
Total Bilirubin: 0.4 mg/dL (ref 0.2–1.2)
Total Protein: 6.7 g/dL (ref 6.0–8.3)

## 2017-05-21 LAB — PSA: PSA: 0.17 ng/mL (ref 0.10–4.00)

## 2017-05-21 MED ORDER — PANTOPRAZOLE SODIUM 40 MG PO TBEC: 40.0000 mg | DELAYED_RELEASE_TABLET | Freq: Every day | ORAL | 3 refills | Status: DC

## 2017-05-21 MED ORDER — ATORVASTATIN CALCIUM 10 MG PO TABS: 10.0000 mg | ORAL_TABLET | Freq: Every day | ORAL | 3 refills | Status: DC

## 2017-05-21 MED ORDER — AMLODIPINE BESYLATE 10 MG PO TABS: 10.0000 mg | ORAL_TABLET | Freq: Every day | ORAL | 3 refills | Status: DC

## 2017-05-21 MED ORDER — LISINOPRIL-HYDROCHLOROTHIAZIDE 20-12.5 MG PO TABS: 1.0000 | ORAL_TABLET | Freq: Every day | ORAL | 3 refills | Status: DC

## 2017-05-21 MED ORDER — TAMSULOSIN HCL 0.4 MG PO CAPS: 0.4000 mg | ORAL_CAPSULE | Freq: Every day | ORAL | 3 refills | Status: DC

## 2017-05-21 NOTE — Assessment & Plan Note (Signed)

## 2017-05-21 NOTE — Patient Instructions (Addendum)
OK to change the lisinopril 40 mg to Lisinopril HCT 20/12.5  - 2 pills in the morning.    Please check your BP on a regular basis as you mentioned, with the goal being less than 140/90  Please continue all other medications as before, and refills have been done if requested.  Please have the pharmacy call with any other refills you may need.  Please continue your efforts at being more active, low cholesterol diet, and weight control.  You are otherwise up to date with prevention measures today.  Please keep your appointments with your specialists as you may have planned  Please go to the LAB in the Basement (turn left off the elevator) for the tests to be done today  You will be contacted by phone if any changes need to be made immediately.  Otherwise, you will receive a letter about your results with an explanation, but please check with MyChart first.  Please remember to sign up for MyChart if you have not done so, as this will be important to you in the future with finding out test results, communicating by private email, and scheduling acute appointments online when needed.  Please return in 6 months, or sooner if needed

## 2017-05-21 NOTE — Assessment & Plan Note (Signed)
Very severe, out of BP meds, to change lisinopril 40 to lisinopril hct 20/12/5  - 2 per day, cont all other meds, check BP at home and next visit, call or return for persistent > 140/90
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
## 2017-06-18 DIAGNOSIS — H04123 Dry eye syndrome of bilateral lacrimal glands: Secondary | ICD-10-CM | POA: Diagnosis not present

## 2017-06-18 DIAGNOSIS — Z961 Presence of intraocular lens: Secondary | ICD-10-CM | POA: Diagnosis not present

## 2017-06-18 DIAGNOSIS — H40013 Open angle with borderline findings, low risk, bilateral: Secondary | ICD-10-CM | POA: Diagnosis not present

## 2017-06-18 DIAGNOSIS — H25812 Combined forms of age-related cataract, left eye: Secondary | ICD-10-CM | POA: Diagnosis not present

## 2017-07-02 DIAGNOSIS — H04123 Dry eye syndrome of bilateral lacrimal glands: Secondary | ICD-10-CM | POA: Diagnosis not present

## 2017-07-02 DIAGNOSIS — H401133 Primary open-angle glaucoma, bilateral, severe stage: Secondary | ICD-10-CM | POA: Diagnosis not present

## 2017-07-02 DIAGNOSIS — Z961 Presence of intraocular lens: Secondary | ICD-10-CM | POA: Diagnosis not present

## 2017-07-02 DIAGNOSIS — H25812 Combined forms of age-related cataract, left eye: Secondary | ICD-10-CM | POA: Diagnosis not present

## 2017-08-10 DIAGNOSIS — C61 Malignant neoplasm of prostate: Secondary | ICD-10-CM | POA: Diagnosis not present

## 2017-08-13 DIAGNOSIS — H04123 Dry eye syndrome of bilateral lacrimal glands: Secondary | ICD-10-CM | POA: Diagnosis not present

## 2017-08-13 DIAGNOSIS — H401133 Primary open-angle glaucoma, bilateral, severe stage: Secondary | ICD-10-CM | POA: Diagnosis not present

## 2017-08-13 DIAGNOSIS — H25812 Combined forms of age-related cataract, left eye: Secondary | ICD-10-CM | POA: Diagnosis not present

## 2017-08-13 DIAGNOSIS — Z961 Presence of intraocular lens: Secondary | ICD-10-CM | POA: Diagnosis not present

## 2017-08-17 DIAGNOSIS — N3281 Overactive bladder: Secondary | ICD-10-CM | POA: Diagnosis not present

## 2017-08-17 DIAGNOSIS — Z8546 Personal history of malignant neoplasm of prostate: Secondary | ICD-10-CM | POA: Diagnosis not present

## 2017-08-28 ENCOUNTER — Other Ambulatory Visit: Payer: Self-pay | Admitting: Internal Medicine

## 2017-09-30 ENCOUNTER — Encounter (HOSPITAL_COMMUNITY): Payer: Self-pay

## 2017-09-30 ENCOUNTER — Inpatient Hospital Stay (HOSPITAL_COMMUNITY)
Admission: EM | Admit: 2017-09-30 | Discharge: 2017-10-02 | DRG: 287 | Disposition: A | Discharge status: Home / Self Care | Payer: Medicare Other | Attending: Cardiovascular Disease | Admitting: Cardiovascular Disease

## 2017-09-30 ENCOUNTER — Emergency Department (HOSPITAL_COMMUNITY): Payer: Medicare Other

## 2017-09-30 DIAGNOSIS — Z809 Family history of malignant neoplasm, unspecified: Secondary | ICD-10-CM

## 2017-09-30 DIAGNOSIS — I5030 Unspecified diastolic (congestive) heart failure: Secondary | ICD-10-CM | POA: Diagnosis not present

## 2017-09-30 DIAGNOSIS — I249 Acute ischemic heart disease, unspecified: Secondary | ICD-10-CM | POA: Diagnosis not present

## 2017-09-30 DIAGNOSIS — E876 Hypokalemia: Secondary | ICD-10-CM | POA: Diagnosis present

## 2017-09-30 DIAGNOSIS — F17210 Nicotine dependence, cigarettes, uncomplicated: Secondary | ICD-10-CM | POA: Diagnosis present

## 2017-09-30 DIAGNOSIS — I5181 Takotsubo syndrome: Principal | ICD-10-CM | POA: Diagnosis present

## 2017-09-30 DIAGNOSIS — Z23 Encounter for immunization: Secondary | ICD-10-CM

## 2017-09-30 DIAGNOSIS — M1990 Unspecified osteoarthritis, unspecified site: Secondary | ICD-10-CM | POA: Diagnosis not present

## 2017-09-30 DIAGNOSIS — I2510 Atherosclerotic heart disease of native coronary artery without angina pectoris: Secondary | ICD-10-CM | POA: Diagnosis not present

## 2017-09-30 DIAGNOSIS — Z8349 Family history of other endocrine, nutritional and metabolic diseases: Secondary | ICD-10-CM | POA: Diagnosis not present

## 2017-09-30 DIAGNOSIS — Z8249 Family history of ischemic heart disease and other diseases of the circulatory system: Secondary | ICD-10-CM

## 2017-09-30 DIAGNOSIS — K219 Gastro-esophageal reflux disease without esophagitis: Secondary | ICD-10-CM | POA: Diagnosis not present

## 2017-09-30 DIAGNOSIS — E785 Hyperlipidemia, unspecified: Secondary | ICD-10-CM | POA: Diagnosis not present

## 2017-09-30 DIAGNOSIS — N529 Male erectile dysfunction, unspecified: Secondary | ICD-10-CM | POA: Diagnosis present

## 2017-09-30 DIAGNOSIS — Z8546 Personal history of malignant neoplasm of prostate: Secondary | ICD-10-CM

## 2017-09-30 DIAGNOSIS — I712 Thoracic aortic aneurysm, without rupture: Secondary | ICD-10-CM | POA: Diagnosis present

## 2017-09-30 DIAGNOSIS — Z7982 Long term (current) use of aspirin: Secondary | ICD-10-CM

## 2017-09-30 DIAGNOSIS — F1011 Alcohol abuse, in remission: Secondary | ICD-10-CM | POA: Diagnosis present

## 2017-09-30 DIAGNOSIS — Z79899 Other long term (current) drug therapy: Secondary | ICD-10-CM

## 2017-09-30 DIAGNOSIS — Z888 Allergy status to other drugs, medicaments and biological substances status: Secondary | ICD-10-CM | POA: Diagnosis not present

## 2017-09-30 DIAGNOSIS — I10 Essential (primary) hypertension: Secondary | ICD-10-CM | POA: Diagnosis not present

## 2017-09-30 DIAGNOSIS — R079 Chest pain, unspecified: Secondary | ICD-10-CM | POA: Diagnosis not present

## 2017-09-30 DIAGNOSIS — R072 Precordial pain: Secondary | ICD-10-CM | POA: Diagnosis not present

## 2017-09-30 LAB — CBC WITH DIFFERENTIAL/PLATELET
Basophils Absolute: 0 10*3/uL (ref 0.0–0.1)
Basophils Relative: 1 %
Eosinophils Absolute: 0.2 10*3/uL (ref 0.0–0.7)
Eosinophils Relative: 2 %
HEMATOCRIT: 35.2 % — AB (ref 39.0–52.0)
HEMOGLOBIN: 12 g/dL — AB (ref 13.0–17.0)
LYMPHS ABS: 1.8 10*3/uL (ref 0.7–4.0)
LYMPHS PCT: 24 %
MCH: 31.4 pg (ref 26.0–34.0)
MCHC: 34.1 g/dL (ref 30.0–36.0)
MCV: 92.1 fL (ref 78.0–100.0)
MONO ABS: 0.5 10*3/uL (ref 0.1–1.0)
MONOS PCT: 6 %
NEUTROS ABS: 5 10*3/uL (ref 1.7–7.7)
NEUTROS PCT: 67 %
Platelets: 338 10*3/uL (ref 150–400)
RBC: 3.82 MIL/uL — ABNORMAL LOW (ref 4.22–5.81)
RDW: 14 % (ref 11.5–15.5)
WBC: 7.5 10*3/uL (ref 4.0–10.5)

## 2017-09-30 LAB — COMPREHENSIVE METABOLIC PANEL
ALBUMIN: 3.6 g/dL (ref 3.5–5.0)
ALK PHOS: 72 U/L (ref 38–126)
ALT: 13 U/L — ABNORMAL LOW (ref 17–63)
ANION GAP: 7 (ref 5–15)
AST: 17 U/L (ref 15–41)
BILIRUBIN TOTAL: 0.6 mg/dL (ref 0.3–1.2)
BUN: 13 mg/dL (ref 6–20)
CALCIUM: 9.1 mg/dL (ref 8.9–10.3)
CO2: 27 mmol/L (ref 22–32)
Chloride: 105 mmol/L (ref 101–111)
Creatinine, Ser: 0.9 mg/dL (ref 0.61–1.24)
GLUCOSE: 116 mg/dL — AB (ref 65–99)
Potassium: 3.1 mmol/L — ABNORMAL LOW (ref 3.5–5.1)
Sodium: 139 mmol/L (ref 135–145)
TOTAL PROTEIN: 6.6 g/dL (ref 6.5–8.1)

## 2017-09-30 LAB — I-STAT TROPONIN, ED: TROPONIN I, POC: 0 ng/mL (ref 0.00–0.08)

## 2017-09-30 LAB — TROPONIN I: Troponin I: <0.03 ng/mL (ref <0.03)

## 2017-09-30 LAB — HEPARIN LEVEL (UNFRACTIONATED): HEPARIN UNFRACTIONATED: 0.19 [IU]/mL — AB (ref 0.30–0.70)

## 2017-09-30 MED ORDER — HEPARIN (PORCINE) IN NACL 100-0.45 UNIT/ML-% IJ SOLN
1100.0000 [IU]/h | INTRAMUSCULAR | Status: DC
  Administered 2017-09-30: 900 [IU]/h via INTRAVENOUS
  Filled 2017-09-30: qty 250

## 2017-09-30 MED ORDER — AMLODIPINE BESYLATE 10 MG PO TABS
10.0000 mg | ORAL_TABLET | Freq: Every day | ORAL | Status: DC
  Administered 2017-10-01 – 2017-10-02 (×2): 10 mg via ORAL
  Filled 2017-09-30 (×2): qty 1

## 2017-09-30 MED ORDER — POTASSIUM CHLORIDE ER 10 MEQ PO TBCR: 10.0000 meq | EXTENDED_RELEASE_TABLET | Freq: Three times a day (TID) | ORAL | Status: DC

## 2017-09-30 MED ORDER — POTASSIUM CHLORIDE CRYS ER 20 MEQ PO TBCR
20.0000 meq | EXTENDED_RELEASE_TABLET | Freq: Three times a day (TID) | ORAL | Status: DC
  Administered 2017-09-30 – 2017-10-02 (×5): 20 meq via ORAL
  Filled 2017-09-30 (×5): qty 1
  Filled 2017-09-30 (×4): qty 2

## 2017-09-30 MED ORDER — SODIUM CHLORIDE 0.9% FLUSH
3.0000 mL | Freq: Two times a day (BID) | INTRAVENOUS | Status: DC
  Administered 2017-09-30: 3 mL via INTRAVENOUS

## 2017-09-30 MED ORDER — ONDANSETRON HCL 4 MG/2ML IJ SOLN: 4.0000 mg | Freq: Four times a day (QID) | INTRAMUSCULAR | Status: DC | PRN

## 2017-09-30 MED ORDER — SODIUM CHLORIDE 0.9 % IV SOLN
INTRAVENOUS | Status: DC
  Administered 2017-09-30: 22:00:00 via INTRAVENOUS

## 2017-09-30 MED ORDER — SODIUM CHLORIDE 0.9% FLUSH: 3.0000 mL | INTRAVENOUS | Status: DC | PRN

## 2017-09-30 MED ORDER — PANTOPRAZOLE SODIUM 40 MG PO TBEC
40.0000 mg | DELAYED_RELEASE_TABLET | Freq: Every day | ORAL | Status: DC
  Administered 2017-10-01 – 2017-10-02 (×2): 40 mg via ORAL
  Filled 2017-09-30 (×3): qty 1

## 2017-09-30 MED ORDER — ATORVASTATIN CALCIUM 80 MG PO TABS
80.0000 mg | ORAL_TABLET | Freq: Every day | ORAL | Status: DC
  Administered 2017-10-01: 80 mg via ORAL
  Filled 2017-09-30: qty 1

## 2017-09-30 MED ORDER — HEPARIN BOLUS VIA INFUSION
4000.0000 [IU] | Freq: Once | INTRAVENOUS | Status: AC
  Administered 2017-09-30: 4000 [IU] via INTRAVENOUS
  Filled 2017-09-30: qty 4000

## 2017-09-30 MED ORDER — OXYCODONE-ACETAMINOPHEN 5-325 MG PO TABS: 1.0000 | ORAL_TABLET | ORAL | Status: DC | PRN

## 2017-09-30 MED ORDER — ASPIRIN EC 81 MG PO TBEC
81.0000 mg | DELAYED_RELEASE_TABLET | Freq: Every day | ORAL | Status: DC
  Administered 2017-10-01 – 2017-10-02 (×2): 81 mg via ORAL
  Filled 2017-09-30 (×2): qty 1

## 2017-09-30 MED ORDER — NITROGLYCERIN 0.4 MG SL SUBL: 0.4000 mg | SUBLINGUAL_TABLET | SUBLINGUAL | Status: DC | PRN

## 2017-09-30 MED ORDER — TAMSULOSIN HCL 0.4 MG PO CAPS
0.4000 mg | ORAL_CAPSULE | Freq: Every day | ORAL | Status: DC
  Administered 2017-10-01 – 2017-10-02 (×2): 0.4 mg via ORAL
  Filled 2017-09-30 (×2): qty 1

## 2017-09-30 MED ORDER — ACETAMINOPHEN 325 MG PO TABS
650.0000 mg | ORAL_TABLET | ORAL | Status: DC | PRN
  Administered 2017-10-01: 650 mg via ORAL
  Filled 2017-09-30: qty 2

## 2017-09-30 MED ORDER — LISINOPRIL 40 MG PO TABS
40.0000 mg | ORAL_TABLET | Freq: Every day | ORAL | Status: DC
  Administered 2017-10-01 – 2017-10-02 (×2): 40 mg via ORAL
  Filled 2017-09-30 (×2): qty 1

## 2017-09-30 MED ORDER — INFLUENZA VAC SPLIT QUAD 0.5 ML IM SUSY
0.5000 mL | PREFILLED_SYRINGE | INTRAMUSCULAR | Status: AC
  Administered 2017-10-01: 0.5 mL via INTRAMUSCULAR
  Filled 2017-09-30: qty 0.5

## 2017-09-30 MED ORDER — SODIUM CHLORIDE 0.9 % IV SOLN
250.0000 mL | INTRAVENOUS | Status: DC | PRN
Start: 2017-09-30 — End: 2017-10-01
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
## 2017-09-30 NOTE — ED Triage Notes (Signed)
Pt called EMS from daughters home after developing Left sided CP, sharp stabbing in nature, radiating to left neck.  Pt reports nausea, dizziness and diaphoresis with episode.  Ems administered 324mg  ASA and NTG SL x2.  Post meds, pt denies pain

## 2017-09-30 NOTE — ED Notes (Signed)
Attempted report at this time.

## 2017-09-30 NOTE — ED Notes (Signed)
Awaiting meds to be verified by pharmacy

## 2017-09-30 NOTE — ED Notes (Signed)
Patient transported to X-ray 
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
## 2017-09-30 NOTE — Plan of Care (Signed)
Problem: Education: Goal: Knowledge of [facility] Education information/materials will improve Outcome: Progressing Pt aware of planned cardiac catheterization in the morning. NPO 10/4  Problem: Safety: Goal: Ability to remain free from injury will improve Outcome: Progressing Call bell within reach Independent No fall this shift

## 2017-10-01 ENCOUNTER — Encounter (HOSPITAL_COMMUNITY)
Admission: EM | Disposition: A | Discharge status: Home / Self Care | Payer: Self-pay | Source: Home / Self Care | Attending: Cardiovascular Disease

## 2017-10-01 ENCOUNTER — Other Ambulatory Visit (HOSPITAL_COMMUNITY): Payer: Medicare Other

## 2017-10-01 ENCOUNTER — Encounter (HOSPITAL_COMMUNITY): Payer: Self-pay | Admitting: Cardiovascular Disease

## 2017-10-01 HISTORY — PX: LEFT HEART CATH AND CORONARY ANGIOGRAPHY: CATH118249

## 2017-10-01 LAB — CBC
HEMATOCRIT: 33.3 % — AB (ref 39.0–52.0)
HEMOGLOBIN: 11.2 g/dL — AB (ref 13.0–17.0)
MCH: 30.9 pg (ref 26.0–34.0)
MCHC: 33.6 g/dL (ref 30.0–36.0)
MCV: 92 fL (ref 78.0–100.0)
Platelets: 314 10*3/uL (ref 150–400)
RBC: 3.62 MIL/uL — ABNORMAL LOW (ref 4.22–5.81)
RDW: 14.1 % (ref 11.5–15.5)
WBC: 4.8 10*3/uL (ref 4.0–10.5)

## 2017-10-01 LAB — BASIC METABOLIC PANEL
ANION GAP: 6 (ref 5–15)
BUN: 11 mg/dL (ref 6–20)
CHLORIDE: 109 mmol/L (ref 101–111)
CO2: 26 mmol/L (ref 22–32)
Calcium: 8.6 mg/dL — ABNORMAL LOW (ref 8.9–10.3)
Creatinine, Ser: 0.83 mg/dL (ref 0.61–1.24)
GFR calc Af Amer: >60 mL/min (ref >60)
GFR calc non Af Amer: >60 mL/min (ref >60)
GLUCOSE: 92 mg/dL (ref 65–99)
POTASSIUM: 3.4 mmol/L — AB (ref 3.5–5.1)
Sodium: 141 mmol/L (ref 135–145)

## 2017-10-01 LAB — LIPID PANEL
Cholesterol: 216 mg/dL — ABNORMAL HIGH (ref 0–200)
HDL: 50 mg/dL (ref >40)
LDL CALC: 153 mg/dL — AB (ref 0–99)
Total CHOL/HDL Ratio: 4.3 RATIO
Triglycerides: 65 mg/dL (ref <150)
VLDL: 13 mg/dL (ref 0–40)

## 2017-10-01 LAB — PROTIME-INR
INR: 1.09
Prothrombin Time: 14.1 seconds (ref 11.4–15.2)

## 2017-10-01 LAB — TROPONIN I: Troponin I: <0.03 ng/mL (ref <0.03)

## 2017-10-01 SURGERY — LEFT HEART CATH AND CORONARY ANGIOGRAPHY
Anesthesia: LOCAL

## 2017-10-01 MED ORDER — HEPARIN (PORCINE) IN NACL 2-0.9 UNIT/ML-% IJ SOLN
INTRAMUSCULAR | Status: AC | PRN
  Administered 2017-10-01: 1000 mL

## 2017-10-01 MED ORDER — MIDAZOLAM HCL 2 MG/2ML IJ SOLN
INTRAMUSCULAR | Status: AC
  Filled 2017-10-01: qty 2

## 2017-10-01 MED ORDER — SODIUM CHLORIDE 0.9% FLUSH: 3.0000 mL | INTRAVENOUS | Status: DC | PRN

## 2017-10-01 MED ORDER — SODIUM CHLORIDE 0.9 % IV SOLN: INTRAVENOUS | Status: AC

## 2017-10-01 MED ORDER — HEPARIN (PORCINE) IN NACL 2-0.9 UNIT/ML-% IJ SOLN
INTRAMUSCULAR | Status: AC
  Filled 2017-10-01: qty 1000

## 2017-10-01 MED ORDER — SODIUM CHLORIDE 0.9 % IV SOLN
250.0000 mL | INTRAVENOUS | Status: DC | PRN
Start: 2017-10-01 — End: 2017-10-02

## 2017-10-01 MED ORDER — FENTANYL CITRATE (PF) 100 MCG/2ML IJ SOLN
INTRAMUSCULAR | Status: DC | PRN
  Administered 2017-10-01: 25 ug via INTRAVENOUS

## 2017-10-01 MED ORDER — LIDOCAINE HCL 2 % IJ SOLN
INTRAMUSCULAR | Status: AC
  Filled 2017-10-01: qty 10

## 2017-10-01 MED ORDER — HEPARIN SODIUM (PORCINE) 5000 UNIT/ML IJ SOLN
5000.0000 [IU] | Freq: Three times a day (TID) | INTRAMUSCULAR | Status: DC
  Administered 2017-10-01 – 2017-10-02 (×2): 5000 [IU] via SUBCUTANEOUS
  Filled 2017-10-01 (×3): qty 1

## 2017-10-01 MED ORDER — SODIUM CHLORIDE 0.9% FLUSH
3.0000 mL | Freq: Two times a day (BID) | INTRAVENOUS | Status: DC
  Administered 2017-10-01 – 2017-10-02 (×2): 3 mL via INTRAVENOUS

## 2017-10-01 MED ORDER — FENTANYL CITRATE (PF) 100 MCG/2ML IJ SOLN
INTRAMUSCULAR | Status: AC
  Filled 2017-10-01: qty 2

## 2017-10-01 MED ORDER — MIDAZOLAM HCL 2 MG/2ML IJ SOLN
INTRAMUSCULAR | Status: DC | PRN
  Administered 2017-10-01: 1 mg via INTRAVENOUS

## 2017-10-01 MED ORDER — LIDOCAINE HCL (PF) 1 % IJ SOLN
INTRAMUSCULAR | Status: DC | PRN
  Administered 2017-10-01: 18 mL via SUBCUTANEOUS

## 2017-10-01 MED ORDER — IOPAMIDOL (ISOVUE-370) INJECTION 76%
INTRAVENOUS | Status: DC | PRN
  Administered 2017-10-01: 60 mL via INTRA_ARTERIAL

## 2017-10-01 MED ORDER — IOPAMIDOL (ISOVUE-370) INJECTION 76%
INTRAVENOUS | Status: AC
  Filled 2017-10-01: qty 100

## 2017-10-01 SURGICAL SUPPLY — 7 items
CATH INFINITI 5FR MULTPACK ANG (CATHETERS) ×1 IMPLANT
KIT HEART LEFT (KITS) ×2 IMPLANT
PACK CARDIAC CATHETERIZATION (CUSTOM PROCEDURE TRAY) ×2 IMPLANT
SHEATH PINNACLE 5F 10CM (SHEATH) ×1 IMPLANT
SYR MEDRAD MARK V 150ML (SYRINGE) ×2 IMPLANT
TRANSDUCER W/STOPCOCK (MISCELLANEOUS) ×2 IMPLANT
WIRE EMERALD 3MM-J .035X150CM (WIRE) ×1 IMPLANT
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
## 2017-10-01 NOTE — Plan of Care (Signed)
Problem: Education: Goal: Knowledge of [facility] Education information/materials will improve Outcome: Progressing Discussed and administered the influenza vaccine.  Provide education handout.   Provided education regarding monitoring cath. site-   Notify nurse if notices signs of infection, bleeding, pain.

## 2017-10-01 NOTE — Interval H&P Note (Signed)
History and Physical Interval Note:  10/01/2017 8:48 AM  Brian Allen.  has presented today for surgery, with the diagnosis of cp  The various methods of treatment have been discussed with the patient and family. After consideration of risks, benefits and other options for treatment, the patient has consented to  Procedure(s): LEFT HEART CATH AND CORONARY ANGIOGRAPHY (N/A) as a surgical intervention .  The patient's history has been reviewed, patient examined, no change in status, stable for surgery.  I have reviewed the patient's chart and labs.  Questions were answered to the patient's satisfaction.     [name]

## 2017-10-01 NOTE — Interval H&P Note (Signed)
History and Physical Interval Note:  10/01/2017 8:47 AM  Brian Allen.  has presented today for surgery, with the diagnosis of cp  The various methods of treatment have been discussed with the patient and family. After consideration of risks, benefits and other options for treatment, the patient has consented to  Procedure(s): LEFT HEART CATH AND CORONARY ANGIOGRAPHY (N/A) as a surgical intervention .  The patient's history has been reviewed, patient examined, no change in status, stable for surgery.  I have reviewed the patient's chart and labs.  Questions were answered to the patient's satisfaction.     Maheen Cwikla S

## 2017-10-01 NOTE — Progress Notes (Addendum)
Site area: RFA Site Prior to Removal:  Level 0 Pressure Applied For:20 min Manual:   yes Patient Status During Pull:  stable Post Pull Site:  Level 0 Post Pull Instructions Given:  yes Post Pull Pulses Present: palpable Dressing Applied:  tegaderm Bedrest begins @ 1000 till 1400 Comments:

## 2017-10-02 ENCOUNTER — Ambulatory Visit (HOSPITAL_COMMUNITY): Payer: Medicare Other

## 2017-10-02 DIAGNOSIS — Z23 Encounter for immunization: Secondary | ICD-10-CM | POA: Diagnosis not present

## 2017-10-02 DIAGNOSIS — I5030 Unspecified diastolic (congestive) heart failure: Secondary | ICD-10-CM

## 2017-10-02 LAB — BASIC METABOLIC PANEL
Anion gap: 4 — ABNORMAL LOW (ref 5–15)
BUN: 8 mg/dL (ref 6–20)
CALCIUM: 8.8 mg/dL — AB (ref 8.9–10.3)
CO2: 24 mmol/L (ref 22–32)
Chloride: 109 mmol/L (ref 101–111)
Creatinine, Ser: 0.9 mg/dL (ref 0.61–1.24)
GFR calc Af Amer: >60 mL/min (ref >60)
GLUCOSE: 95 mg/dL (ref 65–99)
Potassium: 3.9 mmol/L (ref 3.5–5.1)
Sodium: 137 mmol/L (ref 135–145)

## 2017-10-02 LAB — CBC
HEMATOCRIT: 35 % — AB (ref 39.0–52.0)
Hemoglobin: 11.6 g/dL — ABNORMAL LOW (ref 13.0–17.0)
MCH: 30.7 pg (ref 26.0–34.0)
MCHC: 33.1 g/dL (ref 30.0–36.0)
MCV: 92.6 fL (ref 78.0–100.0)
Platelets: 322 10*3/uL (ref 150–400)
RBC: 3.78 MIL/uL — ABNORMAL LOW (ref 4.22–5.81)
RDW: 14.4 % (ref 11.5–15.5)
WBC: 5.1 10*3/uL (ref 4.0–10.5)

## 2017-10-02 LAB — ECHOCARDIOGRAM COMPLETE
HEIGHTINCHES: 65 in
WEIGHTICAEL: 2329.6 [oz_av]

## 2017-10-02 MED ORDER — ATORVASTATIN CALCIUM 80 MG PO TABS: 80.0000 mg | ORAL_TABLET | Freq: Every day | ORAL | 3 refills | Status: DC

## 2017-10-02 MED ORDER — NITROGLYCERIN 0.4 MG SL SUBL: 0.4000 mg | SUBLINGUAL_TABLET | SUBLINGUAL | 1 refills | Status: DC | PRN

## 2017-10-02 MED ORDER — POTASSIUM CHLORIDE CRYS ER 10 MEQ PO TBCR: 10.0000 meq | EXTENDED_RELEASE_TABLET | Freq: Every day | ORAL | 3 refills | Status: DC

## 2017-10-02 MED ORDER — CARVEDILOL 3.125 MG PO TABS: 3.1250 mg | ORAL_TABLET | Freq: Two times a day (BID) | ORAL | Status: DC

## 2017-10-02 MED ORDER — CARVEDILOL 3.125 MG PO TABS: 3.1250 mg | ORAL_TABLET | Freq: Two times a day (BID) | ORAL | 3 refills | Status: DC

## 2017-10-02 MED FILL — Lidocaine HCl Local Inj 2%: INTRAMUSCULAR | Qty: 10 | Status: AC

## 2017-10-02 NOTE — Discharge Summary (Signed)
Physician Discharge Summary  Patient ID: Brian Allen. MRN: [id number] DOB/AGE: 1946-12-27 71 y.o.  Admit date: 09/30/2017 Discharge date: 10/02/2017  Admission Diagnoses: Chest pain rule out MI Hypertension Hyperlipidemia Tobacco use disorder  Discharge Diagnoses:  Principal problem: Chest pain Active Problems:   Possible Takotsubo syndrome   CAD   Ascending aortic aneurysm   Hypokalemia -resolved   Hypertension   Hyperlipidemia   Tobacco use disorder  Discharged Condition: fair  Hospital Course: 71 year old male with past medical history of hypertension, hyperlipidemia, GERD, prostate cancer, tobacco use disorder and arthritis had left-sided chest pain improving with 2 sublingual nitroglycerin and aspirin use. Patient also had nausea, dizziness and diaphoresis along with radiation of the pain to left neck. He underwent cardiac catheterization that showed mild coronary artery disease with the apical aneurysm suggestive of Takotsubo cardiomyopathy. Patient was already on lisinopril and beta blocker was added. His hydrochlorothiazide was discontinued for hypokalemia. He was given instruction to avoid heavy lifting, pulling or pushing. He will see me in 2 weeks and primary care physician in one month.  Consults: cardiology  Significant Diagnostic Studies: labs: Basic metabolic panel was unremarkable except for potassium of 3.1 which is improved with the potassium supplementation and discontinuation of hydrochlorothiazide. CBC was essentially unremarkable with borderline hemoglobin level of 12 g. Total cholesterol was elevated at 216 mg and LDL cholesterol was elevated at 153 milligrams.  Treatments: cardiac meds: lisinopril (Prinivil), atorvastatin, aspirin, carvedilol and amlodipine.  Discharge Exam: Blood pressure 134/71, pulse 72, temperature 98.2 F (36.8 C), temperature source Oral, resp. rate 18, height 5\' 5"  (1.651 m), weight 66 kg (145 lb 9.6 oz), SpO2 95 %. General  appearance: alert, cooperative and appears stated age. Head: Normocephalic, atraumatic. Eyes: Brown eyes, pink conjunctiva, corneas clear. PERRL, EOM's intact.  Neck: No adenopathy, no carotid bruit, no JVD, supple, symmetrical, trachea midline and thyroid not enlarged. Resp: Clear to auscultation bilaterally. Cardio: Regular rate and rhythm, S1, S2 normal, II/VI systolic murmur, no click, rub or gallop. GI: Soft, non-tender; bowel sounds normal; no organomegaly. Extremities: No edema, cyanosis or clubbing. Skin: Warm and dry.  Neurologic: Alert and oriented X 3, normal strength and tone. Normal coordination and gait.  Disposition: 01-Home or Self Care   Allergies as of 10/02/2017      Reactions   Sildenafil Citrate Other (See Comments)   Dizziness      Medication List    STOP taking these medications   lisinopril-hydrochlorothiazide 20-12.5 MG tablet Commonly known as:  ZESTORETIC     TAKE these medications   acetaminophen 325 MG tablet Commonly known as:  TYLENOL Take 325-650 mg by mouth every 6 (six) hours as needed (for pain).   amLODipine 10 MG tablet Commonly known as:  NORVASC Take 1 tablet (10 mg total) by mouth daily.   aspirin 81 MG tablet Take 81 mg by mouth daily.   atorvastatin 80 MG tablet Commonly known as:  LIPITOR Take 1 tablet (80 mg total) by mouth daily at 6 PM. What changed:  medication strength  how much to take  when to take this   carvedilol 3.125 MG tablet Commonly known as:  COREG Take 1 tablet (3.125 mg total) by mouth 2 (two) times daily with a meal.   latanoprost 0.005 % ophthalmic solution Commonly known as:  XALATAN Place 1 drop into both eyes at bedtime.   lisinopril 40 MG tablet Commonly known as:  PRINIVIL,ZESTRIL Take 1 tablet (40 mg total) by mouth daily.   nitroGLYCERIN  0.4 MG SL tablet Commonly known as:  NITROSTAT Place 1 tablet (0.4 mg total) under the tongue every 5 (five) minutes x 3 doses as needed for chest  pain.   pantoprazole 40 MG tablet Commonly known as:  PROTONIX Take 1 tablet (40 mg total) by mouth daily.   potassium chloride 10 MEQ tablet Commonly known as:  K-DUR,KLOR-CON Take 1 tablet (10 mEq total) by mouth daily.   tamsulosin 0.4 MG Caps capsule Commonly known as:  FLOMAX Take 1 capsule (0.4 mg total) by mouth daily.   timolol 0.5 % ophthalmic solution Commonly known as:  BETIMOL Place 1 drop into both eyes every morning.      Follow-up Information    Biagio Borg, MD. Schedule an appointment as soon as possible for a visit in 1 month.   Specialties:  Internal Medicine, Radiology Contact information: [place] [address] [phone]        Dixie Dials, MD.   Specialty:  Cardiology Why:  Office is closed due to holiday. Please call Monday 10/8 to schedule a hospital follow up within 7-10days from discharge date.  Contact information: [place] [address] [phone]           Signed: Birdie Riddle 10/02/2017, 12:47 PM

## 2017-10-02 NOTE — Progress Notes (Signed)
  Echocardiogram 2D Echocardiogram has been performed.  Brian Allen 10/02/2017, 10:56 AM

## 2017-10-02 NOTE — Progress Notes (Signed)
Patient has received discharge instructions including when to call the MD, prescriptions, and follow up appointments. Patient verbalizes understanding of discharge instructions. Patient has secured all belongings and is escorted out by staff.

## 2017-10-02 NOTE — Plan of Care (Signed)
Educated patient on the importance of smoking cessation to reduce progression of cardiovascular issues

## 2017-10-07 DIAGNOSIS — G609 Hereditary and idiopathic neuropathy, unspecified: Secondary | ICD-10-CM | POA: Diagnosis not present

## 2017-10-07 DIAGNOSIS — G603 Idiopathic progressive neuropathy: Secondary | ICD-10-CM | POA: Diagnosis not present

## 2017-10-07 DIAGNOSIS — R202 Paresthesia of skin: Secondary | ICD-10-CM | POA: Diagnosis not present

## 2017-10-07 DIAGNOSIS — M545 Low back pain: Secondary | ICD-10-CM | POA: Diagnosis not present

## 2017-10-07 DIAGNOSIS — R634 Abnormal weight loss: Secondary | ICD-10-CM | POA: Diagnosis not present

## 2017-10-07 DIAGNOSIS — M5417 Radiculopathy, lumbosacral region: Secondary | ICD-10-CM | POA: Diagnosis not present

## 2017-10-07 DIAGNOSIS — G5602 Carpal tunnel syndrome, left upper limb: Secondary | ICD-10-CM | POA: Diagnosis not present

## 2017-10-07 DIAGNOSIS — E538 Deficiency of other specified B group vitamins: Secondary | ICD-10-CM | POA: Diagnosis not present

## 2017-10-14 ENCOUNTER — Ambulatory Visit (INDEPENDENT_AMBULATORY_CARE_PROVIDER_SITE_OTHER): Payer: Medicare Other | Admitting: Internal Medicine

## 2017-10-14 ENCOUNTER — Encounter: Payer: Self-pay | Admitting: Internal Medicine

## 2017-10-14 VITALS — BP 104/76 | HR 66 | Temp 98.9°F | Ht 65.0 in | Wt 154.0 lb

## 2017-10-14 DIAGNOSIS — E785 Hyperlipidemia, unspecified: Secondary | ICD-10-CM | POA: Diagnosis not present

## 2017-10-14 DIAGNOSIS — Z0000 Encounter for general adult medical examination without abnormal findings: Secondary | ICD-10-CM | POA: Diagnosis not present

## 2017-10-14 DIAGNOSIS — R0789 Other chest pain: Secondary | ICD-10-CM | POA: Diagnosis not present

## 2017-10-14 DIAGNOSIS — I10 Essential (primary) hypertension: Secondary | ICD-10-CM

## 2017-10-14 DIAGNOSIS — F17200 Nicotine dependence, unspecified, uncomplicated: Secondary | ICD-10-CM | POA: Diagnosis not present
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
## 2017-10-14 NOTE — Assessment & Plan Note (Signed)
Resolved, no change in further evaluation or tx

## 2017-10-14 NOTE — Assessment & Plan Note (Signed)
stable overall by history and exam, recent data reviewed with pt, and pt to continue medical treatment as before,  to f/u any worsening symptoms or concerns BP Readings from Last 3 Encounters:  10/14/17 104/76  10/02/17 134/71  05/21/17 (!) 200/110

## 2017-10-14 NOTE — Assessment & Plan Note (Signed)
Lab Results  Component Value Date   LDLCALC 153 (H) 10/01/2017  recently uncontrolled, now on high dose lipitor, tolerating well, cont med and lower chol diet

## 2017-10-14 NOTE — Assessment & Plan Note (Signed)
Quit since oct 4; encouraged further evaluation

## 2017-10-14 NOTE — Patient Instructions (Addendum)
Please continue all other medications as before, and refills have been done if requested.  Please have the pharmacy call with any other refills you may need.  Please keep your appointments with your specialists as you may have planned  OK to cancel the [place] [facility] appt here  Please return in 3 months, or sooner if needed, with Lab testing done 3-5 days before

## 2017-11-05 DIAGNOSIS — H04123 Dry eye syndrome of bilateral lacrimal glands: Secondary | ICD-10-CM | POA: Diagnosis not present

## 2017-11-05 DIAGNOSIS — H25812 Combined forms of age-related cataract, left eye: Secondary | ICD-10-CM | POA: Diagnosis not present

## 2017-11-05 DIAGNOSIS — H401133 Primary open-angle glaucoma, bilateral, severe stage: Secondary | ICD-10-CM | POA: Diagnosis not present

## 2017-11-05 DIAGNOSIS — Z961 Presence of intraocular lens: Secondary | ICD-10-CM | POA: Diagnosis not present

## 2017-11-24 ENCOUNTER — Ambulatory Visit: Payer: Medicare Other | Admitting: Internal Medicine

## 2018-01-14 ENCOUNTER — Encounter: Payer: Self-pay | Admitting: Internal Medicine

## 2018-01-14 ENCOUNTER — Ambulatory Visit (INDEPENDENT_AMBULATORY_CARE_PROVIDER_SITE_OTHER): Payer: Medicare Other | Admitting: Internal Medicine

## 2018-01-14 ENCOUNTER — Other Ambulatory Visit (INDEPENDENT_AMBULATORY_CARE_PROVIDER_SITE_OTHER): Payer: Medicare Other

## 2018-01-14 VITALS — BP 142/88 | HR 76 | Temp 98.7°F | Ht 65.0 in | Wt 161.0 lb

## 2018-01-14 DIAGNOSIS — E785 Hyperlipidemia, unspecified: Secondary | ICD-10-CM

## 2018-01-14 DIAGNOSIS — Z0000 Encounter for general adult medical examination without abnormal findings: Secondary | ICD-10-CM

## 2018-01-14 DIAGNOSIS — I10 Essential (primary) hypertension: Secondary | ICD-10-CM

## 2018-01-14 DIAGNOSIS — C61 Malignant neoplasm of prostate: Secondary | ICD-10-CM

## 2018-01-14 LAB — URINALYSIS, ROUTINE W REFLEX MICROSCOPIC
BILIRUBIN URINE: NEGATIVE
HGB URINE DIPSTICK: NEGATIVE
KETONES UR: NEGATIVE
Leukocytes, UA: NEGATIVE
NITRITE: NEGATIVE
RBC / HPF: NONE SEEN (ref 0–?)
Specific Gravity, Urine: 1.015 (ref 1.000–1.030)
Total Protein, Urine: NEGATIVE
UROBILINOGEN UA: 0.2 (ref 0.0–1.0)
Urine Glucose: NEGATIVE
WBC UA: NONE SEEN (ref 0–?)
pH: 7 (ref 5.0–8.0)

## 2018-01-14 LAB — CBC WITH DIFFERENTIAL/PLATELET
BASOS PCT: 1.2 % (ref 0.0–3.0)
Basophils Absolute: 0.1 10*3/uL (ref 0.0–0.1)
EOS ABS: 0.3 10*3/uL (ref 0.0–0.7)
Eosinophils Relative: 4.7 % (ref 0.0–5.0)
HCT: 39.3 % (ref 39.0–52.0)
HEMOGLOBIN: 13.2 g/dL (ref 13.0–17.0)
Lymphocytes Relative: 31.1 % (ref 12.0–46.0)
Lymphs Abs: 1.7 10*3/uL (ref 0.7–4.0)
MCHC: 33.5 g/dL (ref 30.0–36.0)
MCV: 93.3 fl (ref 78.0–100.0)
MONO ABS: 0.6 10*3/uL (ref 0.1–1.0)
Monocytes Relative: 10.6 % (ref 3.0–12.0)
Neutro Abs: 2.8 10*3/uL (ref 1.4–7.7)
Neutrophils Relative %: 52.4 % (ref 43.0–77.0)
Platelets: 297 10*3/uL (ref 150.0–400.0)
RBC: 4.21 Mil/uL — AB (ref 4.22–5.81)
RDW: 14.1 % (ref 11.5–15.5)
WBC: 5.4 10*3/uL (ref 4.0–10.5)

## 2018-01-14 LAB — LIPID PANEL
CHOL/HDL RATIO: 2
Cholesterol: 149 mg/dL (ref 0–200)
HDL: 61.5 mg/dL (ref >39.00)
LDL CALC: 75 mg/dL (ref 0–99)
NonHDL: 87.49
Triglycerides: 61 mg/dL (ref 0.0–149.0)
VLDL: 12.2 mg/dL (ref 0.0–40.0)

## 2018-01-14 LAB — HEPATIC FUNCTION PANEL
ALT: 16 U/L (ref 0–53)
AST: 16 U/L (ref 0–37)
Albumin: 4.3 g/dL (ref 3.5–5.2)
Alkaline Phosphatase: 67 U/L (ref 39–117)
Bilirubin, Direct: 0.1 mg/dL (ref 0.0–0.3)
Total Bilirubin: 0.7 mg/dL (ref 0.2–1.2)
Total Protein: 6.7 g/dL (ref 6.0–8.3)

## 2018-01-14 LAB — PSA: PSA: 0.08 ng/mL — ABNORMAL LOW (ref 0.10–4.00)

## 2018-01-14 LAB — BASIC METABOLIC PANEL
BUN: 15 mg/dL (ref 6–23)
CO2: 30 mEq/L (ref 19–32)
Calcium: 9.5 mg/dL (ref 8.4–10.5)
Chloride: 106 mEq/L (ref 96–112)
Creatinine, Ser: 0.96 mg/dL (ref 0.40–1.50)
GFR: 98.73 mL/min (ref >60.00)
GLUCOSE: 95 mg/dL (ref 70–99)
POTASSIUM: 4.5 meq/L (ref 3.5–5.1)
Sodium: 142 mEq/L (ref 135–145)

## 2018-01-14 LAB — TSH: TSH: 0.98 u[IU]/mL (ref 0.35–4.50)

## 2018-01-14 MED ORDER — ZOSTER VAC RECOMB ADJUVANTED 50 MCG/0.5ML IM SUSR: 0.5000 mL | Freq: Once | INTRAMUSCULAR | 1 refills | Status: AC
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
## 2018-01-16 ENCOUNTER — Encounter: Payer: Self-pay | Admitting: Internal Medicine

## 2018-01-16 NOTE — Assessment & Plan Note (Signed)

## 2018-01-16 NOTE — Assessment & Plan Note (Signed)
Recent restart statin, cont current tx

## 2018-01-16 NOTE — Assessment & Plan Note (Signed)
To restart lisinopril

## 2018-01-16 NOTE — Assessment & Plan Note (Signed)
Also for psa and f/u urology

## 2018-04-30 ENCOUNTER — Telehealth: Payer: Self-pay | Admitting: Internal Medicine

## 2018-04-30 NOTE — Telephone Encounter (Signed)
Copied from [place] [id number]. Topic: Quick Communication - See Telephone Encounter >> Apr 30, 2018  3:56 PM Conception Chancy, NT wrote: CRM for notification. See Telephone encounter for: 04/30/18.  Patient spouse is calling and states the patient needs the following prescriptions refilled.   atorvastatin (LIPITOR) 80 MG tablet  amLODipine (NORVASC) 10 MG tablet lisinopril (PRINIVIL,ZESTRIL) 40 MG tablet   [facility] [place] [facility] [address]  ([place]) [place] [address] Phone: [phone] Fax: [phone]

## 2018-05-03 ENCOUNTER — Other Ambulatory Visit: Payer: Self-pay

## 2018-05-03 MED ORDER — LISINOPRIL 40 MG PO TABS: 40.0000 mg | ORAL_TABLET | Freq: Every day | ORAL | 0 refills | Status: DC

## 2018-05-03 MED ORDER — AMLODIPINE BESYLATE 10 MG PO TABS: 10.0000 mg | ORAL_TABLET | Freq: Every day | ORAL | 0 refills | Status: DC

## 2018-05-03 MED ORDER — ATORVASTATIN CALCIUM 80 MG PO TABS: 80.0000 mg | ORAL_TABLET | Freq: Every day | ORAL | 3 refills | Status: DC

## 2018-05-18 DIAGNOSIS — Z961 Presence of intraocular lens: Secondary | ICD-10-CM | POA: Diagnosis not present

## 2018-05-18 DIAGNOSIS — H401133 Primary open-angle glaucoma, bilateral, severe stage: Secondary | ICD-10-CM | POA: Diagnosis not present

## 2018-05-18 DIAGNOSIS — H04123 Dry eye syndrome of bilateral lacrimal glands: Secondary | ICD-10-CM | POA: Diagnosis not present

## 2018-05-18 DIAGNOSIS — H25812 Combined forms of age-related cataract, left eye: Secondary | ICD-10-CM | POA: Diagnosis not present

## 2018-06-09 ENCOUNTER — Other Ambulatory Visit: Payer: Self-pay | Admitting: Internal Medicine

## 2018-06-09 MED ORDER — TAMSULOSIN HCL 0.4 MG PO CAPS: 0.4000 mg | ORAL_CAPSULE | Freq: Every day | ORAL | 1 refills | Status: DC

## 2018-06-09 NOTE — Addendum Note (Signed)
Addended by: Earnstine Regal on: 06/09/2018 09:57 AM   Modules accepted: Orders

## 2018-06-09 NOTE — Addendum Note (Signed)
Addended by: Earnstine Regal on: 06/09/2018 09:59 AM   Modules accepted: Orders

## 2018-06-18 ENCOUNTER — Other Ambulatory Visit: Payer: Self-pay

## 2018-06-18 MED ORDER — ATORVASTATIN CALCIUM 80 MG PO TABS: 80.0000 mg | ORAL_TABLET | Freq: Every day | ORAL | 2 refills | Status: DC

## 2018-06-29 DIAGNOSIS — H25812 Combined forms of age-related cataract, left eye: Secondary | ICD-10-CM | POA: Diagnosis not present

## 2018-06-29 DIAGNOSIS — H401133 Primary open-angle glaucoma, bilateral, severe stage: Secondary | ICD-10-CM | POA: Diagnosis not present

## 2018-06-29 DIAGNOSIS — H04123 Dry eye syndrome of bilateral lacrimal glands: Secondary | ICD-10-CM | POA: Diagnosis not present

## 2018-06-29 DIAGNOSIS — Z961 Presence of intraocular lens: Secondary | ICD-10-CM | POA: Diagnosis not present

## 2018-07-15 ENCOUNTER — Ambulatory Visit: Payer: Medicare Other | Admitting: Internal Medicine

## 2018-07-22 ENCOUNTER — Encounter: Payer: Self-pay | Admitting: Internal Medicine

## 2018-07-22 ENCOUNTER — Ambulatory Visit (INDEPENDENT_AMBULATORY_CARE_PROVIDER_SITE_OTHER): Payer: Medicare Other | Admitting: Internal Medicine

## 2018-07-22 VITALS — BP 116/78 | HR 75 | Temp 98.3°F | Ht 65.0 in | Wt 155.0 lb

## 2018-07-22 DIAGNOSIS — I10 Essential (primary) hypertension: Secondary | ICD-10-CM

## 2018-07-22 DIAGNOSIS — E785 Hyperlipidemia, unspecified: Secondary | ICD-10-CM | POA: Diagnosis not present

## 2018-07-22 DIAGNOSIS — M1712 Unilateral primary osteoarthritis, left knee: Secondary | ICD-10-CM

## 2018-07-22 DIAGNOSIS — Z0000 Encounter for general adult medical examination without abnormal findings: Secondary | ICD-10-CM | POA: Diagnosis not present
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
## 2018-07-22 NOTE — Assessment & Plan Note (Signed)
Cont same tx, declines ortho f/u, for handicap parking application signed

## 2018-07-22 NOTE — Patient Instructions (Signed)
Please continue all other medications as before, and refills have been done if requested.  Please have the pharmacy call with any other refills you may need.  Please continue your efforts at being more active, low cholesterol diet, and weight control.  You are otherwise up to date with prevention measures today.  Please keep your appointments with your specialists as you may have planned  You are given the handicap parking application filled out  Please return in 6 months, or sooner if needed, with Lab testing done 3-5 days before

## 2018-07-23 NOTE — Assessment & Plan Note (Signed)
stable overall by history and exam, recent data reviewed with pt, and pt to continue medical treatment as before,  to f/u any worsening symptoms or concerns BP Readings from Last 3 Encounters:  07/22/18 116/78  01/14/18 (!) 142/88  10/14/17 104/76

## 2018-07-23 NOTE — Assessment & Plan Note (Signed)
stable overall by history and exam, recent data reviewed with pt, and pt to continue medical treatment as before,  to f/u any worsening symptoms or concerns Lab Results  Component Value Date   LDLCALC 75 01/14/2018

## 2018-09-13 ENCOUNTER — Other Ambulatory Visit: Payer: Self-pay | Admitting: Internal Medicine

## 2018-09-22 ENCOUNTER — Ambulatory Visit (INDEPENDENT_AMBULATORY_CARE_PROVIDER_SITE_OTHER): Payer: Medicare Other | Admitting: Internal Medicine

## 2018-09-22 ENCOUNTER — Encounter: Payer: Self-pay | Admitting: Internal Medicine

## 2018-09-22 VITALS — BP 120/82 | HR 72 | Temp 98.5°F | Ht 65.0 in | Wt 158.0 lb

## 2018-09-22 DIAGNOSIS — I10 Essential (primary) hypertension: Secondary | ICD-10-CM

## 2018-09-22 DIAGNOSIS — R51 Headache: Secondary | ICD-10-CM

## 2018-09-22 DIAGNOSIS — N529 Male erectile dysfunction, unspecified: Secondary | ICD-10-CM

## 2018-09-22 DIAGNOSIS — Z23 Encounter for immunization: Secondary | ICD-10-CM

## 2018-09-22 DIAGNOSIS — R519 Headache, unspecified: Secondary | ICD-10-CM | POA: Insufficient documentation

## 2018-09-22 MED ORDER — PREDNISONE 10 MG PO TABS: ORAL_TABLET | ORAL | 0 refills | Status: DC

## 2018-09-22 MED ORDER — TRAMADOL HCL 50 MG PO TABS: 50.0000 mg | ORAL_TABLET | Freq: Four times a day (QID) | ORAL | 0 refills | Status: DC | PRN

## 2018-09-22 MED ORDER — GABAPENTIN 100 MG PO CAPS: 100.0000 mg | ORAL_CAPSULE | Freq: Three times a day (TID) | ORAL | 2 refills | Status: DC

## 2018-09-22 MED ORDER — SILDENAFIL CITRATE 100 MG PO TABS: 50.0000 mg | ORAL_TABLET | Freq: Every day | ORAL | 11 refills | Status: AC | PRN

## 2018-09-22 NOTE — Assessment & Plan Note (Signed)
stable overall by history and exam, recent data reviewed with pt, and pt to continue medical treatment as before,  to f/u any worsening symptoms or concerns  

## 2018-09-22 NOTE — Patient Instructions (Signed)
You are given the handicap parking application signed today  Please take all new medication as prescribed - the gabapentin for nerve pain, tramadol for pain if needed, prednisone, and viagra  Please continue all other medications as before, and refills have been done if requested.  Please have the pharmacy call with any other refills you may need.  Please keep your appointments with your specialists as you may have planned
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
## 2018-09-22 NOTE — Assessment & Plan Note (Signed)
bilat upper cervical occipital, diff includes neuritic or underlying c spine djd/ddd related; for gabapentin asd, tramadol prn breakthrough pain, predpac asd

## 2018-09-22 NOTE — Assessment & Plan Note (Signed)
Ok for viagra prn, has not taken NTG sl prn for many years

## 2018-09-23 ENCOUNTER — Ambulatory Visit: Payer: Medicare Other | Admitting: Internal Medicine

## 2018-11-29 ENCOUNTER — Ambulatory Visit: Payer: Self-pay | Admitting: *Deleted

## 2018-11-29 NOTE — Telephone Encounter (Signed)
Patient is calling to report he has a knot(cyst) in the back of his neck. While talking he states he has had dizziness and headache. BP- 172/86 P 94. Patient states he has had the dizziness on/off for about 2 weeks and the headache started today. Appointment made for evaluation of new onset dizziness.  Reason for Disposition . [1] MODERATE dizziness (e.g., interferes with normal activities) AND [2] has NOT been evaluated by physician for this  (Exception: dizziness caused by heat exposure, sudden standing, or poor fluid intake)  Answer Assessment - Initial Assessment Questions 1. DESCRIPTION: "Describe your dizziness."     Occurs at different times- but normally when going from laying to standing 2. LIGHTHEADED: "Do you feel lightheaded?" (e.g., somewhat faint, woozy, weak upon standing)     Feels like he is going to fall out 3. VERTIGO: "Do you feel like either you or the room is spinning or tilting?" (i.e. vertigo)     no 4. SEVERITY: "How bad is it?"  "Do you feel like you are going to faint?" "Can you stand and walk?"   - MILD - walking normally   - MODERATE - interferes with normal activities (e.g., work, school)    - SEVERE - unable to stand, requires support to walk, feels like passing out now.      Does not happen all the time- patient states he recovers fairly quickly. It has been happening for a couple days 5. ONSET:  "When did the dizziness begin?"     2 days ago 6. AGGRAVATING FACTORS: "Does anything make it worse?" (e.g., standing, change in head position)     Change in position- sitting to standing, laying to standing 7. HEART RATE: "Can you tell me your heart rate?" "How many beats in 15 seconds?"  (Note: not all patients can do this)       No change in heart rate 8. CAUSE: "What do you think is causing the dizziness?"     unknown 9. RECURRENT SYMPTOM: "Have you had dizziness before?" If so, ask: "When was the last time?" "What happened that time?"     Never happened  before 10. OTHER SYMPTOMS: "Do you have any other symptoms?" (e.g., fever, chest pain, vomiting, diarrhea, bleeding)       Headache ,cyst in neck 11. PREGNANCY: "Is there any chance you are pregnant?" "When was your last menstrual period?"       n/a  Protocols used: DIZZINESS [facility]

## 2018-11-29 NOTE — Telephone Encounter (Signed)
FYI

## 2018-11-30 ENCOUNTER — Encounter: Payer: Self-pay | Admitting: Family

## 2018-11-30 ENCOUNTER — Ambulatory Visit (INDEPENDENT_AMBULATORY_CARE_PROVIDER_SITE_OTHER): Payer: Medicare Other | Admitting: Family

## 2018-11-30 VITALS — BP 140/90 | HR 68 | Temp 98.2°F | Ht 65.0 in | Wt 160.0 lb

## 2018-11-30 DIAGNOSIS — L729 Follicular cyst of the skin and subcutaneous tissue, unspecified: Secondary | ICD-10-CM

## 2018-11-30 MED ORDER — CEPHALEXIN 500 MG PO CAPS: 500.0000 mg | ORAL_CAPSULE | Freq: Three times a day (TID) | ORAL | 0 refills | Status: DC
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
## 2018-12-02 ENCOUNTER — Other Ambulatory Visit: Payer: Self-pay

## 2018-12-02 NOTE — Patient Outreach (Signed)
[facility]) Care Management  12/02/2018  Kairo Laubacher. 1946/08/26 [id number]   Medication Adherence call to Mr. Wynelle Beckmann patient ask to call back early in the morning, patient is due on Lisinopril 40 mg. Mr. Force is showing past due under [facility].   [place] [facility] Direct Dial [phone]  Fax [phone] [email or web address]

## 2018-12-14 ENCOUNTER — Other Ambulatory Visit: Payer: Self-pay | Admitting: Internal Medicine

## 2019-01-27 ENCOUNTER — Other Ambulatory Visit (INDEPENDENT_AMBULATORY_CARE_PROVIDER_SITE_OTHER): Payer: Medicare HMO

## 2019-01-27 ENCOUNTER — Encounter: Payer: Self-pay | Admitting: Internal Medicine

## 2019-01-27 ENCOUNTER — Ambulatory Visit (INDEPENDENT_AMBULATORY_CARE_PROVIDER_SITE_OTHER): Payer: Medicare HMO | Admitting: *Deleted

## 2019-01-27 ENCOUNTER — Ambulatory Visit (INDEPENDENT_AMBULATORY_CARE_PROVIDER_SITE_OTHER): Payer: Medicare HMO | Admitting: Internal Medicine

## 2019-01-27 VITALS — BP 128/86 | HR 72 | Ht 65.0 in | Wt 157.0 lb

## 2019-01-27 VITALS — BP 128/86 | HR 72 | Temp 98.4°F | Ht 65.0 in | Wt 157.0 lb

## 2019-01-27 DIAGNOSIS — Z0000 Encounter for general adult medical examination without abnormal findings: Secondary | ICD-10-CM | POA: Diagnosis not present

## 2019-01-27 DIAGNOSIS — I10 Essential (primary) hypertension: Secondary | ICD-10-CM

## 2019-01-27 LAB — CBC WITH DIFFERENTIAL/PLATELET
Basophils Absolute: 0 10*3/uL (ref 0.0–0.1)
Basophils Relative: 1.2 % (ref 0.0–3.0)
Eosinophils Absolute: 0.2 10*3/uL (ref 0.0–0.7)
Eosinophils Relative: 5 % (ref 0.0–5.0)
HCT: 38.2 % — ABNORMAL LOW (ref 39.0–52.0)
Hemoglobin: 13 g/dL (ref 13.0–17.0)
Lymphocytes Relative: 34.8 % (ref 12.0–46.0)
Lymphs Abs: 1.4 10*3/uL (ref 0.7–4.0)
MCHC: 34.1 g/dL (ref 30.0–36.0)
MCV: 92.2 fl (ref 78.0–100.0)
Monocytes Absolute: 0.4 10*3/uL (ref 0.1–1.0)
Monocytes Relative: 10.4 % (ref 3.0–12.0)
NEUTROS PCT: 48.6 % (ref 43.0–77.0)
Neutro Abs: 1.9 10*3/uL (ref 1.4–7.7)
Platelets: 297 10*3/uL (ref 150.0–400.0)
RBC: 4.14 Mil/uL — ABNORMAL LOW (ref 4.22–5.81)
RDW: 13.7 % (ref 11.5–15.5)
WBC: 3.9 10*3/uL — ABNORMAL LOW (ref 4.0–10.5)

## 2019-01-27 LAB — URINALYSIS, ROUTINE W REFLEX MICROSCOPIC
Bilirubin Urine: NEGATIVE
Hgb urine dipstick: NEGATIVE
KETONES UR: NEGATIVE
Leukocytes, UA: NEGATIVE
Nitrite: NEGATIVE
RBC / HPF: NONE SEEN (ref 0–?)
Specific Gravity, Urine: 1.025 (ref 1.000–1.030)
Total Protein, Urine: NEGATIVE
Urine Glucose: NEGATIVE
Urobilinogen, UA: 0.2 (ref 0.0–1.0)
WBC, UA: NONE SEEN (ref 0–?)
pH: 6 (ref 5.0–8.0)

## 2019-01-27 LAB — HEPATIC FUNCTION PANEL
ALT: 15 U/L (ref 0–53)
AST: 16 U/L (ref 0–37)
Albumin: 4.2 g/dL (ref 3.5–5.2)
Alkaline Phosphatase: 67 U/L (ref 39–117)
Bilirubin, Direct: 0.1 mg/dL (ref 0.0–0.3)
Total Bilirubin: 0.7 mg/dL (ref 0.2–1.2)
Total Protein: 6.6 g/dL (ref 6.0–8.3)

## 2019-01-27 LAB — BASIC METABOLIC PANEL
BUN: 13 mg/dL (ref 6–23)
CO2: 28 mEq/L (ref 19–32)
Calcium: 9.7 mg/dL (ref 8.4–10.5)
Chloride: 107 mEq/L (ref 96–112)
Creatinine, Ser: 0.92 mg/dL (ref 0.40–1.50)
GFR: 97.29 mL/min (ref >60.00)
Glucose, Bld: 99 mg/dL (ref 70–99)
Potassium: 4.3 mEq/L (ref 3.5–5.1)
Sodium: 142 mEq/L (ref 135–145)

## 2019-01-27 LAB — LIPID PANEL
Cholesterol: 164 mg/dL (ref 0–200)
HDL: 49.9 mg/dL (ref >39.00)
LDL Cholesterol: 101 mg/dL — ABNORMAL HIGH (ref 0–99)
NonHDL: 114.42
Total CHOL/HDL Ratio: 3
Triglycerides: 66 mg/dL (ref 0.0–149.0)
VLDL: 13.2 mg/dL (ref 0.0–40.0)

## 2019-01-27 LAB — TSH: TSH: 0.89 u[IU]/mL (ref 0.35–4.50)

## 2019-01-27 LAB — PSA: PSA: 0.08 ng/mL — ABNORMAL LOW (ref 0.10–4.00)

## 2019-01-27 MED ORDER — TAMSULOSIN HCL 0.4 MG PO CAPS: 0.4000 mg | ORAL_CAPSULE | Freq: Every day | ORAL | 3 refills | Status: DC

## 2019-01-27 NOTE — Assessment & Plan Note (Signed)
stable overall by history and exam, recent data reviewed with pt, and pt to continue medical treatment as before,  to f/u any worsening symptoms or concerns  
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
## 2019-01-27 NOTE — Patient Instructions (Signed)

## 2019-01-27 NOTE — Patient Instructions (Signed)
Continue doing brain stimulating activities (puzzles, reading, adult coloring books, staying active) to keep memory sharp.   Continue to eat heart healthy diet (full of fruits, vegetables, whole grains, lean protein, water--limit salt, fat, and sugar intake) and increase physical activity as tolerated.   Brian Allen , Thank you for taking time to come for your Medicare Wellness Visit. I appreciate your ongoing commitment to your health goals. Please review the following plan we discussed and let me know if I can assist you in the future.   These are the goals we discussed: Goals    . Patient Stated     I want to maintain current health status and stay as independent as possible.        This is a list of the screening recommended for you and due dates:  Health Maintenance  Topic Date Due  . Colon Cancer Screening  05/28/2019  . Tetanus Vaccine  01/30/2020  . Flu Shot  Completed  .  Hepatitis C: One time screening is recommended by Center for Disease Control  (CDC) for  adults born from [birth year] through 1967.   Completed  . Pneumonia vaccines  Completed    Preventive Care 47 Years and Older, Male Preventive care refers to lifestyle choices and visits with your health care provider that can promote health and wellness. What does preventive care include?   A yearly physical exam. This is also called an annual well check.  Dental exams once or twice a year.  Routine eye exams. Ask your health care provider how often you should have your eyes checked.  Personal lifestyle choices, including: ? Daily care of your teeth and gums. ? Regular physical activity. ? Eating a healthy diet. ? Avoiding tobacco and drug use. ? Limiting alcohol use. ? Practicing safe sex. ? Taking low doses of aspirin every day. ? Taking vitamin and mineral supplements as recommended by your health care provider. What happens during an annual well check? The services and screenings done by your health care  provider during your annual well check will depend on your age, overall health, lifestyle risk factors, and family history of disease. Counseling Your health care provider may ask you questions about your:  Alcohol use.  Tobacco use.  Drug use.  Emotional well-being.  Home and relationship well-being.  Sexual activity.  Eating habits.  History of falls.  Memory and ability to understand (cognition).  Work and work Statistician. Screening You may have the following tests or measurements:  Height, weight, and BMI.  Blood pressure.  Lipid and cholesterol levels. These may be checked every 5 years, or more frequently if you are over 26 years old.  Skin check.  Lung cancer screening. You may have this screening every year starting at age 64 if you have a 30-pack-year history of smoking and currently smoke or have quit within the past 15 years.  Colorectal cancer screening. All adults should have this screening starting at age 44 and continuing until age 3. You will have tests every 1-10 years, depending on your results and the type of screening test. People at increased risk should start screening at an earlier age. Screening tests may include: ? Guaiac-based fecal occult blood testing. ? Fecal immunochemical test (FIT). ? Stool DNA test. ? Virtual colonoscopy. ? Sigmoidoscopy. During this test, a flexible tube with a tiny camera (sigmoidoscope) is used to examine your rectum and lower colon. The sigmoidoscope is inserted through your anus into your rectum and lower colon. ?  Colonoscopy. During this test, a long, thin, flexible tube with a tiny camera (colonoscope) is used to examine your entire colon and rectum.  Prostate cancer screening. Recommendations will vary depending on your family history and other risks.  Hepatitis C blood test.  Hepatitis B blood test.  Sexually transmitted disease (STD) testing.  Diabetes screening. This is done by checking your blood sugar  (glucose) after you have not eaten for a while (fasting). You may have this done every 1-3 years.  Abdominal aortic aneurysm (AAA) screening. You may need this if you are a current or former smoker.  Osteoporosis. You may be screened starting at age 45 if you are at high risk. Talk with your health care provider about your test results, treatment options, and if necessary, the need for more tests. Vaccines Your health care provider may recommend certain vaccines, such as:  Influenza vaccine. This is recommended every year.  Tetanus, diphtheria, and acellular pertussis (Tdap, Td) vaccine. You may need a Td booster every 10 years.  Varicella vaccine. You may need this if you have not been vaccinated.  Zoster vaccine. You may need this after age 47.  Measles, mumps, and rubella (MMR) vaccine. You may need at least one dose of MMR if you were born in 1957 or later. You may also need a second dose.  Pneumococcal 13-valent conjugate (PCV13) vaccine. One dose is recommended after age 41.  Pneumococcal polysaccharide (PPSV23) vaccine. One dose is recommended after age 67.  Meningococcal vaccine. You may need this if you have certain conditions.  Hepatitis A vaccine. You may need this if you have certain conditions or if you travel or work in places where you may be exposed to hepatitis A.  Hepatitis B vaccine. You may need this if you have certain conditions or if you travel or work in places where you may be exposed to hepatitis B.  Haemophilus influenzae type b (Hib) vaccine. You may need this if you have certain risk factors. Talk to your health care provider about which screenings and vaccines you need and how often you need them. This information is not intended to replace advice given to you by your health care provider. Make sure you discuss any questions you have with your health care provider. Document Released: 01/11/2016 Document Revised: 02/04/2018 Document Reviewed:  10/16/2015 Elsevier Interactive Patient Education  2019 [employer].

## 2019-03-08 DIAGNOSIS — H2512 Age-related nuclear cataract, left eye: Secondary | ICD-10-CM | POA: Diagnosis not present

## 2019-03-08 DIAGNOSIS — H04123 Dry eye syndrome of bilateral lacrimal glands: Secondary | ICD-10-CM | POA: Diagnosis not present

## 2019-03-08 DIAGNOSIS — H401192 Primary open-angle glaucoma, unspecified eye, moderate stage: Secondary | ICD-10-CM | POA: Diagnosis not present

## 2019-03-08 DIAGNOSIS — Z961 Presence of intraocular lens: Secondary | ICD-10-CM | POA: Diagnosis not present

## 2019-05-30 ENCOUNTER — Other Ambulatory Visit: Payer: Self-pay

## 2019-05-30 ENCOUNTER — Encounter: Payer: Self-pay | Admitting: Gastroenterology

## 2019-05-30 ENCOUNTER — Ambulatory Visit (AMBULATORY_SURGERY_CENTER): Payer: Self-pay | Admitting: *Deleted

## 2019-05-30 VITALS — Ht 67.0 in | Wt 160.0 lb

## 2019-05-30 DIAGNOSIS — Z86010 Personal history of colonic polyps: Secondary | ICD-10-CM

## 2019-05-30 MED ORDER — SUPREP BOWEL PREP KIT 17.5-3.13-1.6 GM/177ML PO SOLN: 1.0000 | Freq: Once | ORAL | 0 refills | Status: AC

## 2019-05-30 NOTE — Progress Notes (Signed)
previsit per telephone, patient requesting I interview his wife, Brian Allen, [phone] No egg or soy allergy known to patient  No issues with past sedation with any surgeries  or procedures, no intubation problems  No diet pills per patient No home 02 use per patient  No blood thinners per patient  Pt denies issues with constipation  No A fib or A flutter  EMMI video sent to pt's e mail

## 2019-06-10 ENCOUNTER — Telehealth: Payer: Self-pay

## 2019-06-10 NOTE — Telephone Encounter (Signed)
Covid-19 screening questions  Have you traveled in the last 14 days? No. If yes where?  Do you now or have you had a fever in the last 14 days? No.  Do you have any respiratory symptoms of shortness of breath or cough now or in the last 14 days? No.  Do you have any family members or close contacts with diagnosed or suspected Covid-19 in the past 14 days? No.  Have you been tested for Covid-19 and found to be positive? No.       

## 2019-06-13 ENCOUNTER — Other Ambulatory Visit: Payer: Self-pay

## 2019-06-13 ENCOUNTER — Encounter: Payer: Self-pay | Admitting: Gastroenterology

## 2019-06-13 ENCOUNTER — Ambulatory Visit (AMBULATORY_SURGERY_CENTER): Payer: Medicare HMO | Admitting: Gastroenterology

## 2019-06-13 VITALS — BP 127/82 | HR 60 | Temp 98.3°F | Resp 22 | Ht 65.0 in | Wt 157.0 lb

## 2019-06-13 DIAGNOSIS — D125 Benign neoplasm of sigmoid colon: Secondary | ICD-10-CM

## 2019-06-13 DIAGNOSIS — I10 Essential (primary) hypertension: Secondary | ICD-10-CM | POA: Diagnosis not present

## 2019-06-13 DIAGNOSIS — K635 Polyp of colon: Secondary | ICD-10-CM

## 2019-06-13 DIAGNOSIS — D123 Benign neoplasm of transverse colon: Secondary | ICD-10-CM | POA: Diagnosis not present

## 2019-06-13 DIAGNOSIS — D124 Benign neoplasm of descending colon: Secondary | ICD-10-CM | POA: Diagnosis not present

## 2019-06-13 DIAGNOSIS — Z860100 Personal history of colon polyps, unspecified: Secondary | ICD-10-CM

## 2019-06-13 DIAGNOSIS — K219 Gastro-esophageal reflux disease without esophagitis: Secondary | ICD-10-CM | POA: Diagnosis not present

## 2019-06-13 MED ORDER — SODIUM CHLORIDE 0.9 % IV SOLN: 500.0000 mL | Freq: Once | INTRAVENOUS | Status: DC

## 2019-06-13 NOTE — Op Note (Signed)
[place] Patient Name: Brian Allen Procedure Date: 06/13/2019 3:09 PM MRN: [id number] Endoscopist: Remo Lipps P. Havery Moros , MD Age: 73 Referring MD:  Date of Birth: 04-Mar-1946 Gender: Male Account #: [id number] Procedure:                Colonoscopy Indications:              Surveillance: Personal history of adenomatous                            polyps on last colonoscopy 3 years ago Medicines:                Monitored Anesthesia Care Procedure:                Pre-Anesthesia Assessment:                           - Prior to the procedure, a History and Physical                            was performed, and patient medications and                            allergies were reviewed. The patient's tolerance of                            previous anesthesia was also reviewed. The risks                            and benefits of the procedure and the sedation                            options and risks were discussed with the patient.                            All questions were answered, and informed consent                            was obtained. Prior Anticoagulants: The patient has                            taken no previous anticoagulant or antiplatelet                            agents. ASA Grade Assessment: II - A patient with                            mild systemic disease. After reviewing the risks                            and benefits, the patient was deemed in                            satisfactory condition to undergo the procedure.  After obtaining informed consent, the colonoscope                            was passed under direct vision. Throughout the                            procedure, the patient's blood pressure, pulse, and                            oxygen saturations were monitored continuously. The                            Colonoscope was introduced through the anus and                            advanced to the the  cecum, identified by                            appendiceal orifice and ileocecal valve. The                            colonoscopy was performed without difficulty. The                            patient tolerated the procedure well. The quality                            of the bowel preparation was adequate. The                            ileocecal valve, appendiceal orifice, and rectum                            were photographed. Scope In: 3:16:18 PM Scope Out: 3:39:13 PM Scope Withdrawal Time: 0 hours 18 minutes 7 seconds  Total Procedure Duration: 0 hours 22 minutes 55 seconds  Findings:                 The perianal and digital rectal examinations were                            normal.                           A 3 mm polyp was found in the hepatic flexure,                            lying on top of a moderate sized lipoma. The polyp                            was sessile. The polyp was removed with a cold                            biopsy forceps. Resection and retrieval were  complete.                           A 4 mm polyp was found in the transverse colon. The                            polyp was sessile. The polyp was removed with a                            cold snare. Resection and retrieval were complete.                           A diminutive polyp was found in the descending                            colon. The polyp was sessile. The polyp was removed                            with a cold biopsy forceps. Resection and retrieval                            were complete.                           Two sessile polyps were found in the sigmoid colon.                            The polyps were diminutive in size. These polyps                            were removed with a cold biopsy forceps. Resection                            and retrieval were complete.                           A few small-mouthed diverticula were found in the                             sigmoid colon.                           Internal hemorrhoids were found during retroflexion.                           The exam was otherwise without abnormality. Complications:            No immediate complications. Estimated blood loss:                            Minimal. Estimated Blood Loss:     Estimated blood loss was minimal. Impression:               - One 3 mm polyp at the hepatic flexure lying on  top of a lipoma, removed with a cold biopsy                            forceps. Resected and retrieved.                           - One 4 mm polyp in the transverse colon, removed                            with a cold snare. Resected and retrieved.                           - One diminutive polyp in the descending colon,                            removed with a cold biopsy forceps. Resected and                            retrieved.                           - Two diminutive polyps in the sigmoid colon,                            removed with a cold biopsy forceps. Resected and                            retrieved.                           - Diverticulosis in the sigmoid colon.                           - Internal hemorrhoids.                           - The examination was otherwise normal. Recommendation:           - Patient has a contact number available for                            emergencies. The signs and symptoms of potential                            delayed complications were discussed with the                            patient. Return to normal activities tomorrow.                            Written discharge instructions were provided to the                            patient.                           -  Resume previous diet.                           - Continue present medications.                           - Await pathology results. Remo Lipps P. Armbruster, MD 06/13/2019 3:46:29 PM This report has been signed electronically.

## 2019-06-13 NOTE — Progress Notes (Signed)
Pt's states no medical or surgical changes since previsit or office visit. (via phone)

## 2019-06-13 NOTE — Progress Notes (Signed)
Report given to PACU, vss 

## 2019-06-13 NOTE — Patient Instructions (Signed)
Handouts Provided:  Polyps  YOU HAD AN ENDOSCOPIC PROCEDURE TODAY AT [facility]:   Refer to the procedure report that was given to you for any specific questions about what was found during the examination.  If the procedure report does not answer your questions, please call your gastroenterologist to clarify.  If you requested that your care partner not be given the details of your procedure findings, then the procedure report has been included in a sealed envelope for you to review at your convenience later.  YOU SHOULD EXPECT: Some feelings of bloating in the abdomen. Passage of more gas than usual.  Walking can help get rid of the air that was put into your GI tract during the procedure and reduce the bloating. If you had a lower endoscopy (such as a colonoscopy or flexible sigmoidoscopy) you may notice spotting of blood in your stool or on the toilet paper. If you underwent a bowel prep for your procedure, you may not have a normal bowel movement for a few days.  Please Note:  You might notice some irritation and congestion in your nose or some drainage.  This is from the oxygen used during your procedure.  There is no need for concern and it should clear up in a day or so.  SYMPTOMS TO REPORT IMMEDIATELY:   Following lower endoscopy (colonoscopy or flexible sigmoidoscopy):  Excessive amounts of blood in the stool  Significant tenderness or worsening of abdominal pains  Swelling of the abdomen that is new, acute  Fever of 100F or higher  For urgent or emergent issues, a gastroenterologist can be reached at any hour by calling [phone].   DIET:  We do recommend a small meal at first, but then you may proceed to your regular diet.  Drink plenty of fluids but you should avoid alcoholic beverages for 24 hours.  ACTIVITY:  You should plan to take it easy for the rest of today and you should NOT DRIVE or use heavy machinery until tomorrow (because of the sedation  medicines used during the test).    FOLLOW UP: Our staff will call the number listed on your records 48-72 hours following your procedure to check on you and address any questions or concerns that you may have regarding the information given to you following your procedure. If we do not reach you, we will leave a message.  We will attempt to reach you two times.  During this call, we will ask if you have developed any symptoms of COVID 19. If you develop any symptoms (ie: fever, flu-like symptoms, shortness of breath, cough etc.) before then, please call [phone].  If you test positive for Covid 19 in the 2 weeks post procedure, please call and report this information to [place].    If any biopsies were taken you will be contacted by phone or by letter within the next 1-3 weeks.  Please call us at [phone] if you have not heard about the biopsies in 3 weeks.    SIGNATURES/CONFIDENTIALITY: You and/or your care partner have signed paperwork which will be entered into your electronic medical record.  These signatures attest to the fact that that the information above on your After Visit Summary has been reviewed and is understood.  Full responsibility of the confidentiality of this discharge information lies with you and/or your care-partner.

## 2019-06-13 NOTE — Progress Notes (Signed)
Called to room to assist during endoscopic procedure.  Patient ID and intended procedure confirmed with present staff. Received instructions for my participation in the procedure from the performing physician.  

## 2019-06-14 ENCOUNTER — Other Ambulatory Visit: Payer: Self-pay | Admitting: Internal Medicine

## 2019-06-14 NOTE — Telephone Encounter (Signed)
Pt's wife stated that pt is out of lisinopril (PRINIVIL,ZESTRIL) 40 MG tablet and does not know the last time he has had a refill or taken the medication. She sounded very unsure. Requesting refill as soon as possible. Please advise. May need to confirm pt's current medications. CB#[phone]   [place] ([address]), [place] [phone] (Phone) [phone] (Fax)

## 2019-06-14 NOTE — Telephone Encounter (Signed)
aleady taking lisionpril 40

## 2019-06-15 ENCOUNTER — Telehealth: Payer: Self-pay

## 2019-06-15 NOTE — Telephone Encounter (Signed)
  Follow up Call-  Call back number 06/13/2019  Post procedure Call Back phone  # [phone]  Permission to leave phone message Yes  Some recent data might be hidden     Patient questions:  Do you have a fever, pain , or abdominal swelling? No. Pain Score  0 *  Have you tolerated food without any problems? Yes.    Have you been able to return to your normal activities? Yes.    Do you have any questions about your discharge instructions: Diet   No. Medications  No. Follow up visit  No.  Do you have questions or concerns about your Care? No.  Actions: * If pain score is 4 or above: 1. No action needed, pain <4.Have you developed a fever since your procedure? no  2.   Have you had an respiratory symptoms (SOB or cough) since your procedure? no  3.   Have you tested positive for COVID 19 since your procedure no  4.   Have you had any family members/close contacts diagnosed with the COVID 19 since your procedure?  no   If yes to any of these questions please route to Joylene John, RN and Alphonsa Gin, Therapist, sports.

## 2019-06-16 ENCOUNTER — Encounter: Payer: Self-pay | Admitting: Gastroenterology

## 2019-06-27 ENCOUNTER — Telehealth: Payer: Self-pay | Admitting: *Deleted

## 2019-06-27 MED ORDER — AMLODIPINE BESYLATE 10 MG PO TABS: 10.0000 mg | ORAL_TABLET | Freq: Every day | ORAL | 3 refills | Status: DC

## 2019-06-27 MED ORDER — LISINOPRIL 40 MG PO TABS: 40.0000 mg | ORAL_TABLET | Freq: Every day | ORAL | 3 refills | Status: DC

## 2019-06-27 MED ORDER — CARVEDILOL 3.125 MG PO TABS: 3.1250 mg | ORAL_TABLET | Freq: Two times a day (BID) | ORAL | 3 refills | Status: DC

## 2019-06-27 NOTE — Telephone Encounter (Signed)
Since the med was not specified, I sent all 3 BP med refills  Please let me know if others need to be refilled

## 2019-06-27 NOTE — Telephone Encounter (Signed)
Called patient in response to a VM he left nurse. Patient explained that his pharmacy stated that he had to see PCP to get his blood pressure medication refilled. Patient said that he is out of medication and he does have an upcoming appointment with PCP 07/28/19.

## 2019-07-02 ENCOUNTER — Other Ambulatory Visit: Payer: Self-pay | Admitting: Internal Medicine

## 2019-07-28 ENCOUNTER — Ambulatory Visit: Payer: Medicare HMO | Admitting: Internal Medicine

## 2019-08-02 ENCOUNTER — Other Ambulatory Visit: Payer: Self-pay

## 2019-08-02 ENCOUNTER — Encounter: Payer: Self-pay | Admitting: Internal Medicine

## 2019-08-02 ENCOUNTER — Ambulatory Visit (INDEPENDENT_AMBULATORY_CARE_PROVIDER_SITE_OTHER): Payer: Medicare HMO | Admitting: Internal Medicine

## 2019-08-02 VITALS — BP 142/90 | HR 66 | Temp 98.1°F | Resp 14 | Ht 65.0 in | Wt 157.0 lb

## 2019-08-02 DIAGNOSIS — E538 Deficiency of other specified B group vitamins: Secondary | ICD-10-CM

## 2019-08-02 DIAGNOSIS — E559 Vitamin D deficiency, unspecified: Secondary | ICD-10-CM | POA: Diagnosis not present

## 2019-08-02 DIAGNOSIS — N3281 Overactive bladder: Secondary | ICD-10-CM | POA: Diagnosis not present

## 2019-08-02 DIAGNOSIS — I10 Essential (primary) hypertension: Secondary | ICD-10-CM

## 2019-08-02 DIAGNOSIS — Z0000 Encounter for general adult medical examination without abnormal findings: Secondary | ICD-10-CM | POA: Diagnosis not present

## 2019-08-02 DIAGNOSIS — E785 Hyperlipidemia, unspecified: Secondary | ICD-10-CM

## 2019-08-02 DIAGNOSIS — E611 Iron deficiency: Secondary | ICD-10-CM

## 2019-08-02 MED ORDER — FESOTERODINE FUMARATE ER 4 MG PO TB24: 4.0000 mg | ORAL_TABLET | Freq: Every day | ORAL | 3 refills | Status: DC

## 2019-08-02 MED ORDER — ATORVASTATIN CALCIUM 80 MG PO TABS: ORAL_TABLET | ORAL | 3 refills | Status: DC

## 2019-08-02 NOTE — Assessment & Plan Note (Signed)
stable overall by history and exam, recent data reviewed with pt, and pt to continue medical treatment as before,  to f/u any worsening symptoms or concerns, cont low chol diet, statin and f/u lab next visit

## 2019-08-02 NOTE — Assessment & Plan Note (Signed)
UA neg at last visit, for toviaz 4 qd, call in 4 wks if needs 8 mg daily,  to f/u any worsening symptoms or concerns

## 2019-08-02 NOTE — Assessment & Plan Note (Signed)
stable overall by history and exam, recent data reviewed with pt, and pt to continue medical treatment as before,  to f/u any worsening symptoms or concerns  
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
## 2019-08-02 NOTE — Patient Instructions (Addendum)
Please take all new medication as prescribed - the toviaz for the bladder  Please continue all other medications as before, and refills have been done if requested.  Please have the pharmacy call with any other refills you may need.  Please continue your efforts at being more active, low cholesterol diet, and weight control..  Please keep your appointments with your specialists as you may have planned  Please return in 6 months, or sooner if needed, with Lab testing done 3-5 days before

## 2019-08-15 ENCOUNTER — Emergency Department (HOSPITAL_COMMUNITY)
Admission: EM | Admit: 2019-08-15 | Discharge: 2019-08-15 | Disposition: A | Discharge status: Home / Self Care | Payer: Medicare HMO | Attending: Emergency Medicine | Admitting: Emergency Medicine

## 2019-08-15 ENCOUNTER — Emergency Department (HOSPITAL_COMMUNITY): Payer: Medicare HMO

## 2019-08-15 ENCOUNTER — Encounter (HOSPITAL_COMMUNITY): Payer: Self-pay | Admitting: Emergency Medicine

## 2019-08-15 ENCOUNTER — Other Ambulatory Visit: Payer: Self-pay

## 2019-08-15 DIAGNOSIS — Y998 Other external cause status: Secondary | ICD-10-CM | POA: Diagnosis not present

## 2019-08-15 DIAGNOSIS — Z7982 Long term (current) use of aspirin: Secondary | ICD-10-CM | POA: Insufficient documentation

## 2019-08-15 DIAGNOSIS — S161XXA Strain of muscle, fascia and tendon at neck level, initial encounter: Secondary | ICD-10-CM

## 2019-08-15 DIAGNOSIS — Y92410 Unspecified street and highway as the place of occurrence of the external cause: Secondary | ICD-10-CM | POA: Insufficient documentation

## 2019-08-15 DIAGNOSIS — M542 Cervicalgia: Secondary | ICD-10-CM | POA: Diagnosis not present

## 2019-08-15 DIAGNOSIS — I10 Essential (primary) hypertension: Secondary | ICD-10-CM | POA: Diagnosis not present

## 2019-08-15 DIAGNOSIS — Z79899 Other long term (current) drug therapy: Secondary | ICD-10-CM | POA: Insufficient documentation

## 2019-08-15 DIAGNOSIS — E785 Hyperlipidemia, unspecified: Secondary | ICD-10-CM | POA: Diagnosis not present

## 2019-08-15 DIAGNOSIS — R51 Headache: Secondary | ICD-10-CM | POA: Insufficient documentation

## 2019-08-15 DIAGNOSIS — S0990XA Unspecified injury of head, initial encounter: Secondary | ICD-10-CM | POA: Diagnosis not present

## 2019-08-15 DIAGNOSIS — Y93I9 Activity, other involving external motion: Secondary | ICD-10-CM | POA: Diagnosis not present

## 2019-08-15 DIAGNOSIS — S199XXA Unspecified injury of neck, initial encounter: Secondary | ICD-10-CM | POA: Diagnosis not present

## 2019-08-15 MED ORDER — DICLOFENAC SODIUM 1 % TD GEL: 2.0000 g | Freq: Four times a day (QID) | TRANSDERMAL | 0 refills | Status: DC

## 2019-08-15 MED ORDER — METHOCARBAMOL 500 MG PO TABS: 500.0000 mg | ORAL_TABLET | Freq: Three times a day (TID) | ORAL | 0 refills | Status: AC
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
## 2019-08-17 DIAGNOSIS — H401132 Primary open-angle glaucoma, bilateral, moderate stage: Secondary | ICD-10-CM | POA: Diagnosis not present

## 2019-08-17 DIAGNOSIS — H04123 Dry eye syndrome of bilateral lacrimal glands: Secondary | ICD-10-CM | POA: Diagnosis not present

## 2019-08-17 DIAGNOSIS — H2512 Age-related nuclear cataract, left eye: Secondary | ICD-10-CM | POA: Diagnosis not present

## 2019-08-17 DIAGNOSIS — Z961 Presence of intraocular lens: Secondary | ICD-10-CM | POA: Diagnosis not present

## 2019-09-20 DIAGNOSIS — H401132 Primary open-angle glaucoma, bilateral, moderate stage: Secondary | ICD-10-CM | POA: Diagnosis not present

## 2019-09-20 DIAGNOSIS — H2512 Age-related nuclear cataract, left eye: Secondary | ICD-10-CM | POA: Diagnosis not present

## 2019-09-20 DIAGNOSIS — Z961 Presence of intraocular lens: Secondary | ICD-10-CM | POA: Diagnosis not present

## 2019-09-20 DIAGNOSIS — H04123 Dry eye syndrome of bilateral lacrimal glands: Secondary | ICD-10-CM | POA: Diagnosis not present

## 2019-12-19 DIAGNOSIS — H2512 Age-related nuclear cataract, left eye: Secondary | ICD-10-CM | POA: Diagnosis not present

## 2019-12-19 DIAGNOSIS — H401132 Primary open-angle glaucoma, bilateral, moderate stage: Secondary | ICD-10-CM | POA: Diagnosis not present

## 2019-12-19 DIAGNOSIS — H04123 Dry eye syndrome of bilateral lacrimal glands: Secondary | ICD-10-CM | POA: Diagnosis not present

## 2019-12-19 DIAGNOSIS — Z961 Presence of intraocular lens: Secondary | ICD-10-CM | POA: Diagnosis not present

## 2020-01-08 DIAGNOSIS — H2512 Age-related nuclear cataract, left eye: Secondary | ICD-10-CM | POA: Diagnosis not present

## 2020-01-09 ENCOUNTER — Ambulatory Visit (HOSPITAL_COMMUNITY)
Admission: EM | Admit: 2020-01-09 | Discharge: 2020-01-09 | Disposition: A | Discharge status: Home / Self Care | Payer: Medicare HMO | Attending: Internal Medicine | Admitting: Internal Medicine

## 2020-01-09 ENCOUNTER — Other Ambulatory Visit: Payer: Self-pay

## 2020-01-09 ENCOUNTER — Encounter (HOSPITAL_COMMUNITY): Payer: Self-pay

## 2020-01-09 DIAGNOSIS — M549 Dorsalgia, unspecified: Secondary | ICD-10-CM

## 2020-01-09 MED ORDER — ACETAMINOPHEN 500 MG PO TABS: 500.0000 mg | ORAL_TABLET | Freq: Four times a day (QID) | ORAL | 0 refills | Status: AC | PRN

## 2020-01-09 MED ORDER — PANTOPRAZOLE SODIUM 40 MG PO TBEC: 40.0000 mg | DELAYED_RELEASE_TABLET | Freq: Every day | ORAL | 1 refills | Status: DC

## 2020-01-09 NOTE — ED Triage Notes (Signed)
Pt presents with lower abdominal pain and lower back pain for about a month; pt states he has had some constipation.
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
## 2020-01-12 DIAGNOSIS — H5703 Miosis: Secondary | ICD-10-CM | POA: Diagnosis not present

## 2020-01-12 DIAGNOSIS — H2512 Age-related nuclear cataract, left eye: Secondary | ICD-10-CM | POA: Diagnosis not present

## 2020-01-12 DIAGNOSIS — H401122 Primary open-angle glaucoma, left eye, moderate stage: Secondary | ICD-10-CM | POA: Diagnosis not present

## 2020-01-12 DIAGNOSIS — H259 Unspecified age-related cataract: Secondary | ICD-10-CM | POA: Diagnosis not present

## 2020-01-13 ENCOUNTER — Ambulatory Visit (INDEPENDENT_AMBULATORY_CARE_PROVIDER_SITE_OTHER): Payer: Medicare HMO | Admitting: Internal Medicine

## 2020-01-13 ENCOUNTER — Other Ambulatory Visit: Payer: Self-pay

## 2020-01-13 ENCOUNTER — Encounter: Payer: Self-pay | Admitting: Internal Medicine

## 2020-01-13 ENCOUNTER — Other Ambulatory Visit: Payer: Self-pay | Admitting: Internal Medicine

## 2020-01-13 VITALS — BP 136/78 | HR 81 | Temp 98.6°F | Ht 65.0 in | Wt 151.0 lb

## 2020-01-13 DIAGNOSIS — E785 Hyperlipidemia, unspecified: Secondary | ICD-10-CM | POA: Diagnosis not present

## 2020-01-13 DIAGNOSIS — E559 Vitamin D deficiency, unspecified: Secondary | ICD-10-CM | POA: Diagnosis not present

## 2020-01-13 DIAGNOSIS — E611 Iron deficiency: Secondary | ICD-10-CM

## 2020-01-13 DIAGNOSIS — K219 Gastro-esophageal reflux disease without esophagitis: Secondary | ICD-10-CM

## 2020-01-13 DIAGNOSIS — I10 Essential (primary) hypertension: Secondary | ICD-10-CM

## 2020-01-13 DIAGNOSIS — K5900 Constipation, unspecified: Secondary | ICD-10-CM

## 2020-01-13 DIAGNOSIS — Z0000 Encounter for general adult medical examination without abnormal findings: Secondary | ICD-10-CM | POA: Diagnosis not present

## 2020-01-13 DIAGNOSIS — R1310 Dysphagia, unspecified: Secondary | ICD-10-CM | POA: Insufficient documentation

## 2020-01-13 DIAGNOSIS — Z0001 Encounter for general adult medical examination with abnormal findings: Secondary | ICD-10-CM

## 2020-01-13 DIAGNOSIS — R1084 Generalized abdominal pain: Secondary | ICD-10-CM | POA: Diagnosis not present

## 2020-01-13 DIAGNOSIS — Z23 Encounter for immunization: Secondary | ICD-10-CM

## 2020-01-13 DIAGNOSIS — E538 Deficiency of other specified B group vitamins: Secondary | ICD-10-CM

## 2020-01-13 LAB — HEPATIC FUNCTION PANEL
ALT: 16 U/L (ref 0–53)
AST: 22 U/L (ref 0–37)
Albumin: 3.8 g/dL (ref 3.5–5.2)
Alkaline Phosphatase: 74 U/L (ref 39–117)
Bilirubin, Direct: 0.1 mg/dL (ref 0.0–0.3)
Total Bilirubin: 0.3 mg/dL (ref 0.2–1.2)
Total Protein: 6.6 g/dL (ref 6.0–8.3)

## 2020-01-13 LAB — CBC WITH DIFFERENTIAL/PLATELET
Basophils Absolute: 0.1 10*3/uL (ref 0.0–0.1)
Basophils Relative: 1.1 % (ref 0.0–3.0)
Eosinophils Absolute: 0.1 10*3/uL (ref 0.0–0.7)
Eosinophils Relative: 2.3 % (ref 0.0–5.0)
HCT: 35 % — ABNORMAL LOW (ref 39.0–52.0)
Hemoglobin: 11.5 g/dL — ABNORMAL LOW (ref 13.0–17.0)
Lymphocytes Relative: 17.8 % (ref 12.0–46.0)
Lymphs Abs: 1 10*3/uL (ref 0.7–4.0)
MCHC: 32.8 g/dL (ref 30.0–36.0)
MCV: 91.6 fl (ref 78.0–100.0)
Monocytes Absolute: 0.5 10*3/uL (ref 0.1–1.0)
Monocytes Relative: 10.1 % (ref 3.0–12.0)
Neutro Abs: 3.7 10*3/uL (ref 1.4–7.7)
Neutrophils Relative %: 68.7 % (ref 43.0–77.0)
Platelets: 346 10*3/uL (ref 150.0–400.0)
RBC: 3.82 Mil/uL — ABNORMAL LOW (ref 4.22–5.81)
RDW: 13.9 % (ref 11.5–15.5)
WBC: 5.4 10*3/uL (ref 4.0–10.5)

## 2020-01-13 LAB — URINALYSIS, ROUTINE W REFLEX MICROSCOPIC
Bilirubin Urine: NEGATIVE
Hgb urine dipstick: NEGATIVE
Ketones, ur: NEGATIVE
Leukocytes,Ua: NEGATIVE
Nitrite: NEGATIVE
RBC / HPF: NONE SEEN (ref 0–?)
Specific Gravity, Urine: 1.02 (ref 1.000–1.030)
Total Protein, Urine: NEGATIVE
Urine Glucose: NEGATIVE
Urobilinogen, UA: 0.2 — AB (ref 0.0–1.0)
WBC, UA: NONE SEEN (ref 0–?)
pH: 6 (ref 5.0–8.0)

## 2020-01-13 LAB — BASIC METABOLIC PANEL
BUN: 13 mg/dL (ref 6–23)
CO2: 28 mEq/L (ref 19–32)
Calcium: 9 mg/dL (ref 8.4–10.5)
Chloride: 109 mEq/L (ref 96–112)
Creatinine, Ser: 0.88 mg/dL (ref 0.40–1.50)
GFR: 102.14 mL/min (ref >60.00)
Glucose, Bld: 81 mg/dL (ref 70–99)
Potassium: 3.9 mEq/L (ref 3.5–5.1)
Sodium: 145 mEq/L (ref 135–145)

## 2020-01-13 LAB — IBC PANEL
Iron: 80 ug/dL (ref 42–165)
Saturation Ratios: 28.3 % (ref 20.0–50.0)
Transferrin: 202 mg/dL — ABNORMAL LOW (ref 212.0–360.0)

## 2020-01-13 LAB — VITAMIN B12: Vitamin B-12: 601 pg/mL (ref 211–911)

## 2020-01-13 LAB — TSH: TSH: 0.71 u[IU]/mL (ref 0.35–4.50)

## 2020-01-13 LAB — PSA: PSA: 0.06 ng/mL — ABNORMAL LOW (ref 0.10–4.00)

## 2020-01-13 LAB — VITAMIN D 25 HYDROXY (VIT D DEFICIENCY, FRACTURES): VITD: 8.85 ng/mL — ABNORMAL LOW (ref 30.00–100.00)

## 2020-01-13 LAB — LIPASE: Lipase: 28 U/L (ref 11.0–59.0)

## 2020-01-13 MED ORDER — VITAMIN D (ERGOCALCIFEROL) 1.25 MG (50000 UNIT) PO CAPS: 50000.0000 [IU] | ORAL_CAPSULE | ORAL | 0 refills | Status: DC

## 2020-01-13 MED ORDER — LINACLOTIDE 72 MCG PO CAPS: 72.0000 ug | ORAL_CAPSULE | Freq: Every day | ORAL | 5 refills | Status: DC

## 2020-01-13 MED ORDER — POLYETHYLENE GLYCOL 3350 17 GM/SCOOP PO POWD: 17.0000 g | Freq: Two times a day (BID) | ORAL | 1 refills | Status: DC | PRN

## 2020-01-13 NOTE — Patient Instructions (Addendum)
You had the flu shot today  Please take all new medication as prescribed - the linzess and/or the miralax for constipation as needed  You will be contacted regarding the referral for: Gastroenterology - Dr Havery Moros  Please continue all other medications as before, and refills have been done if requested.  Please have the pharmacy call with any other refills you may need.  Please continue your efforts at being more active, low cholesterol diet, and weight control.  You are otherwise up to date with prevention measures today.  Please keep your appointments with your specialists as you may have planned  Please go to the LAB at the blood drawing area for the tests to be done  You will be contacted by phone if any changes need to be made immediately.  Otherwise, you will receive a letter about your results with an explanation, but please check with MyChart first.  Please remember to sign up for MyChart if you have not done so, as this will be important to you in the future with finding out test results, communicating by private email, and scheduling acute appointments online when needed.  You can cancel the Feb 4 appt.  Please make an Appointment to return in 6 months, or sooner if needed
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
## 2020-01-14 ENCOUNTER — Encounter: Payer: Self-pay | Admitting: Internal Medicine

## 2020-01-14 NOTE — Assessment & Plan Note (Signed)
Also for GI referral, may need EGD 

## 2020-01-14 NOTE — Assessment & Plan Note (Signed)
stable overall by history and exam, recent data reviewed with pt, and pt to continue medical treatment as before,  to f/u any worsening symptoms or concerns  

## 2020-01-14 NOTE — Assessment & Plan Note (Signed)

## 2020-01-14 NOTE — Assessment & Plan Note (Addendum)
Ok for miralax prn,  to f/u any worsening symptoms or concerns  In addition to the time spent performing CPE, I spent an additional 25 minutes face to face,in which greater than 50% of this time was spent in counseling and coordination of care for patient's acute illness as documented, including the differential dx, treatment, further evaluation and other management of constipation, dysphagia, HTN, GERD, HLD

## 2020-01-28 DIAGNOSIS — Z7982 Long term (current) use of aspirin: Secondary | ICD-10-CM | POA: Diagnosis not present

## 2020-01-28 DIAGNOSIS — I10 Essential (primary) hypertension: Secondary | ICD-10-CM | POA: Diagnosis not present

## 2020-01-28 DIAGNOSIS — K5900 Constipation, unspecified: Secondary | ICD-10-CM | POA: Diagnosis not present

## 2020-01-28 DIAGNOSIS — N529 Male erectile dysfunction, unspecified: Secondary | ICD-10-CM | POA: Diagnosis not present

## 2020-01-28 DIAGNOSIS — I951 Orthostatic hypotension: Secondary | ICD-10-CM | POA: Diagnosis not present

## 2020-01-28 DIAGNOSIS — I2510 Atherosclerotic heart disease of native coronary artery without angina pectoris: Secondary | ICD-10-CM | POA: Diagnosis not present

## 2020-01-28 DIAGNOSIS — G8929 Other chronic pain: Secondary | ICD-10-CM | POA: Diagnosis not present

## 2020-01-28 DIAGNOSIS — Z7722 Contact with and (suspected) exposure to environmental tobacco smoke (acute) (chronic): Secondary | ICD-10-CM | POA: Diagnosis not present

## 2020-01-28 DIAGNOSIS — N3281 Overactive bladder: Secondary | ICD-10-CM | POA: Diagnosis not present

## 2020-01-28 DIAGNOSIS — R69 Illness, unspecified: Secondary | ICD-10-CM | POA: Diagnosis not present

## 2020-02-02 ENCOUNTER — Ambulatory Visit: Payer: Medicare HMO | Admitting: Internal Medicine

## 2020-02-23 ENCOUNTER — Other Ambulatory Visit: Payer: Self-pay

## 2020-02-23 ENCOUNTER — Encounter: Payer: Self-pay | Admitting: Gastroenterology

## 2020-02-23 ENCOUNTER — Ambulatory Visit (INDEPENDENT_AMBULATORY_CARE_PROVIDER_SITE_OTHER): Payer: Medicare HMO | Admitting: Gastroenterology

## 2020-02-23 VITALS — BP 140/82 | HR 89 | Temp 98.7°F | Ht 65.0 in | Wt 152.2 lb

## 2020-02-23 DIAGNOSIS — K219 Gastro-esophageal reflux disease without esophagitis: Secondary | ICD-10-CM | POA: Diagnosis not present

## 2020-02-23 DIAGNOSIS — Z01818 Encounter for other preprocedural examination: Secondary | ICD-10-CM | POA: Diagnosis not present

## 2020-02-23 DIAGNOSIS — R1030 Lower abdominal pain, unspecified: Secondary | ICD-10-CM

## 2020-02-23 DIAGNOSIS — K5900 Constipation, unspecified: Secondary | ICD-10-CM | POA: Diagnosis not present

## 2020-02-23 DIAGNOSIS — R1310 Dysphagia, unspecified: Secondary | ICD-10-CM | POA: Diagnosis not present

## 2020-02-23 MED ORDER — LINACLOTIDE 290 MCG PO CAPS: 290.0000 ug | ORAL_CAPSULE | Freq: Every day | ORAL | 0 refills | Status: DC

## 2020-02-23 MED ORDER — LINACLOTIDE 145 MCG PO CAPS: 145.0000 ug | ORAL_CAPSULE | Freq: Every day | ORAL | 0 refills | Status: DC

## 2020-02-23 MED ORDER — DICYCLOMINE HCL 10 MG PO CAPS: 10.0000 mg | ORAL_CAPSULE | Freq: Three times a day (TID) | ORAL | 2 refills | Status: DC | PRN

## 2020-02-23 MED ORDER — SUCRALFATE 1 GM/10ML PO SUSP: 1.0000 g | Freq: Every day | ORAL | 0 refills | Status: AC
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
## 2020-02-23 NOTE — Patient Instructions (Addendum)
If you are age 76 or older, your body mass index should be between 23-30. Your Body mass index is 25.33 kg/m. If this is out of the aforementioned range listed, please consider follow up with your Primary Care Provider.  If you are age 42 or younger, your body mass index should be between 19-25. Your Body mass index is 25.33 kg/m. If this is out of the aformentioned range listed, please consider follow up with your Primary Care Provider.    You have been scheduled for an endoscopy. Please follow written instructions given to you at your visit today. If you use inhalers (even only as needed), please bring them with you on the day of your procedure.   We have given you samples of the following medication to take: Linzess 163mcg: Take daily in the morning.  Try for 2 to 3 days. If not effective, try Linzess 24mcg daily in the morning.  Increase your Protonix to 40mg  TWICE a day,  We have sent the following medications to your pharmacy for you to pick up at your convenience: Carafate: Take 10 ml every night at bedtime Bentyl 10mg : take every 8 hours as needed   Thank you for entrusting me with your care and for choosing [employer], Dr. Lithium Cellar

## 2020-02-28 ENCOUNTER — Other Ambulatory Visit: Payer: Self-pay

## 2020-02-28 ENCOUNTER — Ambulatory Visit (INDEPENDENT_AMBULATORY_CARE_PROVIDER_SITE_OTHER): Payer: Medicare HMO

## 2020-02-28 DIAGNOSIS — Z1159 Encounter for screening for other viral diseases: Secondary | ICD-10-CM | POA: Diagnosis not present

## 2020-02-29 LAB — SARS CORONAVIRUS 2 (TAT 6-24 HRS): SARS Coronavirus 2: NEGATIVE

## 2020-03-01 ENCOUNTER — Other Ambulatory Visit: Payer: Self-pay

## 2020-03-01 ENCOUNTER — Ambulatory Visit (AMBULATORY_SURGERY_CENTER): Payer: Medicare HMO | Admitting: Gastroenterology

## 2020-03-01 ENCOUNTER — Encounter: Payer: Self-pay | Admitting: Gastroenterology

## 2020-03-01 VITALS — BP 142/84 | HR 70 | Temp 96.8°F | Resp 17 | Ht 65.0 in | Wt 152.0 lb

## 2020-03-01 DIAGNOSIS — I10 Essential (primary) hypertension: Secondary | ICD-10-CM | POA: Diagnosis not present

## 2020-03-01 DIAGNOSIS — K449 Diaphragmatic hernia without obstruction or gangrene: Secondary | ICD-10-CM

## 2020-03-01 DIAGNOSIS — K2100 Gastro-esophageal reflux disease with esophagitis, without bleeding: Secondary | ICD-10-CM | POA: Diagnosis not present

## 2020-03-01 DIAGNOSIS — K219 Gastro-esophageal reflux disease without esophagitis: Secondary | ICD-10-CM | POA: Diagnosis not present

## 2020-03-01 DIAGNOSIS — R1310 Dysphagia, unspecified: Secondary | ICD-10-CM | POA: Diagnosis not present

## 2020-03-01 DIAGNOSIS — K3189 Other diseases of stomach and duodenum: Secondary | ICD-10-CM | POA: Diagnosis not present

## 2020-03-01 DIAGNOSIS — E785 Hyperlipidemia, unspecified: Secondary | ICD-10-CM | POA: Diagnosis not present

## 2020-03-01 DIAGNOSIS — K5710 Diverticulosis of small intestine without perforation or abscess without bleeding: Secondary | ICD-10-CM | POA: Diagnosis not present

## 2020-03-01 MED ORDER — SODIUM CHLORIDE 0.9 % IV SOLN: 500.0000 mL | Freq: Once | INTRAVENOUS | Status: DC

## 2020-03-01 NOTE — Progress Notes (Signed)
Temp by JB, V/S by Marianna Fuss    Pt's states no medical or surgical changes since previsit or office visit.

## 2020-03-01 NOTE — Progress Notes (Signed)
Called to room to assist during endoscopic procedure.  Patient ID and intended procedure confirmed with present staff. Received instructions for my participation in the procedure from the performing physician.  

## 2020-03-01 NOTE — Progress Notes (Signed)
Report to PACU, RN, vss, BBS= Clear.  

## 2020-03-01 NOTE — Patient Instructions (Addendum)
Be sure to take your protonix two times a day before meals.  Do not take it on al full stomach or else it won't work. Start you  Liquid carafate today to help your reflux.  Take it every 6 hours as needed. Do not take it for two hours after you eat.  You MUST take your medicine in order to make your reflux better. You will need another EGD in 2 months to recheck your esophagus.  The office will call you, becaue the schedule is not out yet.Brian Allen for choosing [place] for your healthcare needs today.   YOU HAD AN ENDOSCOPIC PROCEDURE TODAY AT [facility]:   Refer to the procedure report that was given to you for any specific questions about what was found during the examination.  If the procedure report does not answer your questions, please call your gastroenterologist to clarify.  If you requested that your care partner not be given the details of your procedure findings, then the procedure report has been included in a sealed envelope for you to review at your convenience later.  YOU SHOULD EXPECT: Some feelings of bloating in the abdomen. Passage of more gas than usual.  Walking can help get rid of the air that was put into your GI tract during the procedure and reduce the bloating.   Please Note:  You might notice some irritation and congestion in your nose or some drainage.  This is from the oxygen used during your procedure.  There is no need for concern and it should clear up in a day or so.  SYMPTOMS TO REPORT IMMEDIATELY:    Following upper endoscopy (EGD)  Vomiting of blood or coffee ground material  New chest pain or pain under the shoulder blades  Painful or persistently difficult swallowing  New shortness of breath  Fever of 100F or higher  Black, tarry-looking stools  For urgent or emergent issues, a gastroenterologist can be reached at any hour by calling [phone]. Do not use MyChart messaging for urgent concerns.    DIET:  We do recommend a small meal  at first, but then you may proceed to your regular diet.  Drink plenty of fluids but you should avoid alcoholic beverages for 24 hours.  ACTIVITY:  You should plan to take it easy for the rest of today and you should NOT DRIVE or use heavy machinery until tomorrow (because of the sedation medicines used during the test).    FOLLOW UP: Our staff will call the number listed on your records 48-72 hours following your procedure to check on you and address any questions or concerns that you may have regarding the information given to you following your procedure. If we do not reach you, we will leave a message.  We will attempt to reach you two times.  During this call, we will ask if you have developed any symptoms of COVID 19. If you develop any symptoms (ie: fever, flu-like symptoms, shortness of breath, cough etc.) before then, please call [phone].  If you test positive for Covid 19 in the 2 weeks post procedure, please call and report this information to [place].    If any biopsies were taken you will be contacted by phone or by letter within the next 1-3 weeks.  Please call us at [phone] if you have not heard about the biopsies in 3 weeks.    SIGNATURES/CONFIDENTIALITY: You and/or your care partner have signed paperwork which will be entered  into your electronic medical record.  These signatures attest to the fact that that the information above on your After Visit Summary has been reviewed and is understood.  Full responsibility of the confidentiality of this discharge information lies with you and/or your care-partner.

## 2020-03-01 NOTE — Op Note (Signed)
[place] Patient Name: Jaison Schelle Procedure Date: 03/01/2020 10:23 AM MRN: [id number] Endoscopist: Remo Lipps P. Havery Moros , MD Age: 74 Referring MD:  Date of Birth: May 04, 1946 Gender: Male Account #: [id number] Procedure:                Upper GI endoscopy Indications:              Dysphagia, Follow-up of gastro-esophageal reflux                            disease, recently increase protonix to twice daily                            dosing with improvement in symptoms Medicines:                Monitored Anesthesia Care Procedure:                Pre-Anesthesia Assessment:                           - Prior to the procedure, a History and Physical                            was performed, and patient medications and                            allergies were reviewed. The patient's tolerance of                            previous anesthesia was also reviewed. The risks                            and benefits of the procedure and the sedation                            options and risks were discussed with the patient.                            All questions were answered, and informed consent                            was obtained. Prior Anticoagulants: The patient has                            taken no previous anticoagulant or antiplatelet                            agents. ASA Grade Assessment: III - A patient with                            severe systemic disease. After reviewing the risks                            and benefits, the patient was deemed in  satisfactory condition to undergo the procedure.                           After obtaining informed consent, the endoscope was                            passed under direct vision. Throughout the                            procedure, the patient's blood pressure, pulse, and                            oxygen saturations were monitored continuously. The                            Endoscope  was introduced through the mouth, and                            advanced to the second part of duodenum. The upper                            GI endoscopy was accomplished without difficulty.                            The patient tolerated the procedure well. Scope In: Scope Out: Findings:                 Esophagogastric landmarks were identified: the                            Z-line was found at 36 cm, the gastroesophageal                            junction was found at 36 cm and the upper extent of                            the gastric folds was found at 38 cm from the                            incisors.                           A 2 cm hiatal hernia was present.                           LA Grade C esophagitis was found in the distal                            esophagus / GEJ with ulceration.                           One benign-appearing, intrinsic mild stenosis was                            found  at the [facility]. This stenosis measured less than                            one cm (in length). Dilation not performed today                            given ulceration / inflammation at the site.                           The exam of the esophagus was otherwise normal.                           The entire examined stomach was normal.                           A small diverticulum was found in the duodenal bulb.                           A single roughly 10 mm subepithelial nodule was                            found in the second portion of the duodenum. Bite                            on bite biopsies were taken with a cold forceps for                            histology. The lesion was hard to palpation with                            forceps.                           The exam of the duodenum was otherwise normal. Complications:            No immediate complications. Estimated blood loss:                            Minimal. Estimated Blood Loss:     Estimated blood loss was  minimal. Impression:               - Esophagogastric landmarks identified.                           - 2 cm hiatal hernia.                           - LA Grade C reflux esophagitis.                           - Benign-appearing esophageal stenosis not dilated                            given active inflammation.                           -  Normal esophagus otherwise.                           - Normal stomach.                           - Duodenal diverticulum.                           - Subepithelial nodule found in the duodenum.                            Biopsied.                           - Normal duodenum otherwise. Recommendation:           - Patient has a contact number available for                            emergencies. The signs and symptoms of potential                            delayed complications were discussed with the                            patient. Return to normal activities tomorrow.                            Written discharge instructions were provided to the                            patient.                           - Resume previous diet.                           - Continue present medications.                           - Continue high dose protonix 40mg  twice daily                            (just recently started this)                           - Start liquid carafate 10cc po q 6 hours PRN                           - Await pathology results.                           - Would repeat EGD in 2 months to check for mucosal                            healing and dilate stricture if dysphagia persists Remo Lipps P. Havery Moros, MD 03/01/2020  10:48:20 AM This report has been signed electronically.

## 2020-03-05 ENCOUNTER — Telehealth: Payer: Self-pay | Admitting: Gastroenterology

## 2020-03-05 ENCOUNTER — Telehealth: Payer: Self-pay

## 2020-03-05 NOTE — Telephone Encounter (Signed)
Could not leave VM

## 2020-03-05 NOTE — Telephone Encounter (Signed)
  Follow up Call-  Call back number 03/01/2020 06/13/2019  Post procedure Call Back phone  # [phone]  Permission to leave phone message Yes Yes  Some recent data might be hidden     Patient questions:  Do you have a fever, pain , or abdominal swelling? No. Pain Score  0 *  Have you tolerated food without any problems? Yes.    Have you been able to return to your normal activities? Yes.    Do you have any questions about your discharge instructions: Diet   No. Medications  No. Follow up visit  No.  Do you have questions or concerns about your Care? No.  Actions: * If pain score is 4 or above: No action needed, pain <4.  1. Have you developed a fever since your procedure? no  2.   Have you had an respiratory symptoms (SOB or cough) since your procedure? no  3.   Have you tested positive for COVID 19 since your procedure no  4.   Have you had any family members/close contacts diagnosed with the COVID 19 since your procedure?  no   If yes to any of these questions please route to Joylene John, RN and Alphonsa Gin, Therapist, sports.

## 2020-03-05 NOTE — Telephone Encounter (Signed)
Deleted other pharmacy from chart.  [facility] on [address] as preferred pharmacy

## 2020-03-07 ENCOUNTER — Telehealth: Payer: Self-pay

## 2020-03-07 NOTE — Telephone Encounter (Signed)
-----   Message from Milus Banister, MD sent at 03/07/2020  5:19 AM EST ----- Regarding: RE: possible EUS Richardson Landry, I think EUS at same time as next EGD is a good idea. Probably 3-4 weeks to allow some time for the esophagitis to heal?  Can you let him know that Chong Sicilian will reach out to him to arrange it?    Thanks  [employer], This man needs EGD with EUS in about one month, GM or DJ.  For duodenal lesion and also possible stricture dilation.  Thanks  THanks ----- Message ----- From: Yetta Flock, MD Sent: 03/06/2020   7:55 AM EST To: Milus Banister, MD, # Subject: possible EUS                                   Hey guys,  This gentleman had an EGD for dysphagia, had pretty good esophagitis / stricture, was not taking his PPI, but incidentally noted to have a roughly 1cm subepithelial lesion in the duodenum, hard to palpation with forceps. Bite on bite biopsies nondiagnostic. He is going to need a repeat EGD with dilation after he's on PPI to heal the esophagitis, wasn't sure if you would consider EUS at the same time to evaluate this lesion. Alternatively I could repeat the EGD, perform dilation and obtain further biopsies, but suspect will again be nondiagnostic. Thanks for your opinion.  Richardson Landry

## 2020-03-07 NOTE — Telephone Encounter (Signed)
Yetta Flock, MD  Milus Banister, MD; Mansouraty, Telford Nab., MD; Timothy Lasso, RN  Thanks Linna Hoff. I will get in touch with him and let him know.   Mykael Trott I'll touch base for scheduling after I speak with him. Thank you   Richardson Landry

## 2020-03-08 ENCOUNTER — Other Ambulatory Visit: Payer: Self-pay

## 2020-03-08 DIAGNOSIS — K319 Disease of stomach and duodenum, unspecified: Secondary | ICD-10-CM

## 2020-04-09 ENCOUNTER — Other Ambulatory Visit: Payer: Self-pay | Admitting: Gastroenterology

## 2020-04-09 ENCOUNTER — Other Ambulatory Visit (HOSPITAL_COMMUNITY): Payer: Medicare HMO

## 2020-04-09 DIAGNOSIS — Z1159 Encounter for screening for other viral diseases: Secondary | ICD-10-CM | POA: Diagnosis not present

## 2020-04-10 LAB — SARS CORONAVIRUS 2 (TAT 6-24 HRS): SARS Coronavirus 2: NEGATIVE

## 2020-04-12 ENCOUNTER — Ambulatory Visit (HOSPITAL_COMMUNITY)
Admission: RE | Admit: 2020-04-12 | Discharge: 2020-04-12 | Disposition: A | Discharge status: Home / Self Care | Payer: Medicare HMO | Attending: Gastroenterology | Admitting: Gastroenterology

## 2020-04-12 ENCOUNTER — Ambulatory Visit (HOSPITAL_COMMUNITY): Payer: Medicare HMO | Admitting: Anesthesiology

## 2020-04-12 ENCOUNTER — Telehealth: Payer: Self-pay | Admitting: Gastroenterology

## 2020-04-12 ENCOUNTER — Encounter (HOSPITAL_COMMUNITY)
Admission: RE | Disposition: A | Discharge status: Home / Self Care | Payer: Self-pay | Source: Home / Self Care | Attending: Gastroenterology

## 2020-04-12 ENCOUNTER — Other Ambulatory Visit: Payer: Self-pay

## 2020-04-12 ENCOUNTER — Encounter (HOSPITAL_COMMUNITY): Payer: Self-pay | Admitting: Gastroenterology

## 2020-04-12 DIAGNOSIS — Z8249 Family history of ischemic heart disease and other diseases of the circulatory system: Secondary | ICD-10-CM | POA: Insufficient documentation

## 2020-04-12 DIAGNOSIS — Z888 Allergy status to other drugs, medicaments and biological substances status: Secondary | ICD-10-CM | POA: Diagnosis not present

## 2020-04-12 DIAGNOSIS — R599 Enlarged lymph nodes, unspecified: Secondary | ICD-10-CM | POA: Insufficient documentation

## 2020-04-12 DIAGNOSIS — K319 Disease of stomach and duodenum, unspecified: Secondary | ICD-10-CM | POA: Diagnosis present

## 2020-04-12 DIAGNOSIS — Z8546 Personal history of malignant neoplasm of prostate: Secondary | ICD-10-CM | POA: Diagnosis not present

## 2020-04-12 DIAGNOSIS — K219 Gastro-esophageal reflux disease without esophagitis: Secondary | ICD-10-CM | POA: Diagnosis not present

## 2020-04-12 DIAGNOSIS — I10 Essential (primary) hypertension: Secondary | ICD-10-CM | POA: Diagnosis not present

## 2020-04-12 DIAGNOSIS — K3189 Other diseases of stomach and duodenum: Secondary | ICD-10-CM | POA: Diagnosis not present

## 2020-04-12 DIAGNOSIS — Z87891 Personal history of nicotine dependence: Secondary | ICD-10-CM | POA: Diagnosis not present

## 2020-04-12 DIAGNOSIS — K269 Duodenal ulcer, unspecified as acute or chronic, without hemorrhage or perforation: Secondary | ICD-10-CM | POA: Diagnosis not present

## 2020-04-12 HISTORY — PX: EUS: SHX5427

## 2020-04-12 HISTORY — PX: ESOPHAGOGASTRODUODENOSCOPY (EGD) WITH PROPOFOL: SHX5813

## 2020-04-12 HISTORY — PX: BIOPSY: SHX5522

## 2020-04-12 SURGERY — ESOPHAGOGASTRODUODENOSCOPY (EGD) WITH PROPOFOL
Anesthesia: Monitor Anesthesia Care

## 2020-04-12 MED ORDER — PROPOFOL 500 MG/50ML IV EMUL
INTRAVENOUS | Status: DC | PRN
  Administered 2020-04-12: 100 ug/kg/min via INTRAVENOUS

## 2020-04-12 MED ORDER — PROPOFOL 10 MG/ML IV BOLUS
INTRAVENOUS | Status: DC | PRN
  Administered 2020-04-12 (×5): 20 mg via INTRAVENOUS

## 2020-04-12 MED ORDER — SODIUM CHLORIDE 0.9 % IV SOLN: INTRAVENOUS | Status: DC

## 2020-04-12 MED ORDER — LACTATED RINGERS IV SOLN
INTRAVENOUS | Status: AC | PRN
  Administered 2020-04-12: 1000 mL via INTRAVENOUS

## 2020-04-12 MED ORDER — PROPOFOL 500 MG/50ML IV EMUL
INTRAVENOUS | Status: AC
  Filled 2020-04-12: qty 50

## 2020-04-12 SURGICAL SUPPLY — 15 items
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
## 2020-04-12 NOTE — Anesthesia Postprocedure Evaluation (Signed)
Anesthesia Post Note  Patient: Brian Allen.  Procedure(s) Performed: ESOPHAGOGASTRODUODENOSCOPY (EGD) WITH PROPOFOL (N/A ) UPPER ENDOSCOPIC ULTRASOUND (EUS) RADIAL (N/A ) BIOPSY     Patient location during evaluation: PACU Anesthesia Type: MAC Level of consciousness: awake and alert Pain management: pain level controlled Vital Signs Assessment: post-procedure vital signs reviewed and stable Respiratory status: spontaneous breathing Cardiovascular status: stable Anesthetic complications: no    Last Vitals:  Vitals:   04/12/20 1040 04/12/20 1050  BP: (!) 164/84 (!) 171/84  Pulse: (!) 51 (!) 48  Resp: 16 17  Temp:    SpO2: 96% 99%    Last Pain:  Vitals:   04/12/20 1050  TempSrc:   PainSc: 0-No pain                 Nolon Nations

## 2020-04-12 NOTE — Anesthesia Procedure Notes (Signed)
Procedure Name: MAC Date/Time: 04/12/2020 10:00 AM Performed by: Lollie Sails, CRNA Pre-anesthesia Checklist: Patient identified, Emergency Drugs available, Suction available, Patient being monitored and Timeout performed Oxygen Delivery Method: Nasal cannula

## 2020-04-12 NOTE — Discharge Instructions (Signed)
YOU HAD AN ENDOSCOPIC PROCEDURE TODAY: Refer to the procedure report and other information in the discharge instructions given to you for any specific questions about what was found during the examination. If this information does not answer your questions, please call [facility] office at [phone] to clarify.  ° °YOU SHOULD EXPECT: Some feelings of bloating in the abdomen. Passage of more gas than usual. Walking can help get rid of the air that was put into your GI tract during the procedure and reduce the bloating. If you had a lower endoscopy (such as a colonoscopy or flexible sigmoidoscopy) you may notice spotting of blood in your stool or on the toilet paper. Some abdominal soreness may be present for a day or two, also. ° °DIET: Your first meal following the procedure should be a light meal and then it is ok to progress to your normal diet. A half-sandwich or bowl of soup is an example of a good first meal. Heavy or fried foods are harder to digest and may make you feel nauseous or bloated. Drink plenty of fluids but you should avoid alcoholic beverages for 24 hours. If you had a esophageal dilation, please see attached instructions for diet.   ° °ACTIVITY: Your care partner should take you home directly after the procedure. You should plan to take it easy, moving slowly for the rest of the day. You can resume normal activity the day after the procedure however YOU SHOULD NOT DRIVE, use power tools, machinery or perform tasks that involve climbing or major physical exertion for 24 hours (because of the sedation medicines used during the test).  ° °SYMPTOMS TO REPORT IMMEDIATELY: °A gastroenterologist can be reached at any hour. Please call [phone]  for any of the following symptoms:  °Following lower endoscopy (colonoscopy, flexible sigmoidoscopy) °Excessive amounts of blood in the stool  °Significant tenderness, worsening of abdominal pains  °Swelling of the abdomen that is new, acute  °Fever of 100° or  higher  °Following upper endoscopy (EGD, EUS, ERCP, esophageal dilation) °Vomiting of blood or coffee ground material  °New, significant abdominal pain  °New, significant chest pain or pain under the shoulder blades  °Painful or persistently difficult swallowing  °New shortness of breath  °Black, tarry-looking or red, bloody stools ° °FOLLOW UP:  °If any biopsies were taken you will be contacted by phone or by letter within the next 1-3 weeks. Call [phone]  if you have not heard about the biopsies in 3 weeks.  °Please also call with any specific questions about appointments or follow up tests. ° °

## 2020-04-12 NOTE — Anesthesia Preprocedure Evaluation (Signed)
Anesthesia Evaluation  Patient identified by MRN, date of birth, ID band Patient awake    Reviewed: Allergy & Precautions, H&P , NPO status , Patient's Chart, lab work & pertinent test results, reviewed documented beta blocker date and time   Airway Mallampati: II  TM Distance: >3 FB Neck ROM: Full    Dental  (+) Poor Dentition, Missing, Loose,    Pulmonary Current Smoker, former smoker,    Pulmonary exam normal breath sounds clear to auscultation       Cardiovascular hypertension, Pt. on medications and Pt. on home beta blockers Normal cardiovascular exam Rhythm:Regular Rate:Normal     Neuro/Psych  Headaches, PSYCHIATRIC DISORDERS Anxiety    GI/Hepatic GERD  ,(+)     substance abuse  alcohol use,   Endo/Other  negative endocrine ROS  Renal/GU negative Renal ROS     Musculoskeletal  (+) Arthritis ,   Abdominal   Peds  Hematology  (+) Blood dyscrasia, anemia ,   Anesthesia Other Findings   Reproductive/Obstetrics                             Anesthesia Physical  Anesthesia Plan  ASA: III  Anesthesia Plan: MAC   Post-op Pain Management:    Induction: Intravenous  PONV Risk Score and Plan: 1 and Propofol infusion, TIVA and Treatment may vary due to age or medical condition  Airway Management Planned: Natural Airway  Additional Equipment: None  Intra-op Plan:   Post-operative Plan:   Informed Consent: I have reviewed the patients History and Physical, chart, labs and discussed the procedure including the risks, benefits and alternatives for the proposed anesthesia with the patient or authorized representative who has indicated his/her understanding and acceptance.     Dental advisory given  Plan Discussed with: CRNA  Anesthesia Plan Comments:         Anesthesia Quick Evaluation

## 2020-04-12 NOTE — Transfer of Care (Signed)
Immediate Anesthesia Transfer of Care Note  Patient: Brian Allen.  Procedure(s) Performed: ESOPHAGOGASTRODUODENOSCOPY (EGD) WITH PROPOFOL (N/A ) UPPER ENDOSCOPIC ULTRASOUND (EUS) RADIAL (N/A ) BIOPSY  Patient Location: PACU and Endoscopy Unit  Anesthesia Type:MAC  Level of Consciousness: awake, drowsy and responds to stimulation  Airway & Oxygen Therapy: Patient Spontanous Breathing and Patient connected to nasal cannula oxygen  Post-op Assessment: Report given to RN and Post -op Vital signs reviewed and stable  Post vital signs: Reviewed and stable  Last Vitals:  Vitals Value Taken Time  BP 151/84 04/12/20 1033  Temp    Pulse 50 04/12/20 1034  Resp 15 04/12/20 1034  SpO2 97 % 04/12/20 1034  Vitals shown include unvalidated device data.  Last Pain:  Vitals:   04/12/20 0825  TempSrc: Oral  PainSc: 0-No pain         Complications: No apparent anesthesia complications

## 2020-04-12 NOTE — Op Note (Signed)
[facility] Patient Name: Brian Allen Procedure Date: 04/12/2020 MRN: [id number] Attending MD: Milus Banister , MD Date of Birth: 01/24/1946 CSN: [id number] Age: 74 Admit Type: Outpatient Procedure:                Upper EUS Indications:              Subepithelial duodenal lesion noted on recent EGD Providers:                Milus Banister, MD, Jobe Igo, RN, Theodora Blow, Technician Referring MD:             Jolly Mango, MD Medicines:                Monitored Anesthesia Care Complications:            No immediate complications. Estimated blood loss:                            None. Estimated Blood Loss:     Estimated blood loss: none. Procedure:                Pre-Anesthesia Assessment:                           - Prior to the procedure, a History and Physical                            was performed, and patient medications and                            allergies were reviewed. The patient's tolerance of                            previous anesthesia was also reviewed. The risks                            and benefits of the procedure and the sedation                            options and risks were discussed with the patient.                            All questions were answered, and informed consent                            was obtained. Prior Anticoagulants: The patient has                            taken no previous anticoagulant or antiplatelet                            agents. ASA Grade Assessment: II - A patient with  mild systemic disease. After reviewing the risks                            and benefits, the patient was deemed in                            satisfactory condition to undergo the procedure.                           After obtaining informed consent, the endoscope was                            passed under direct vision. Throughout the   procedure, the patient's blood pressure, pulse, and                            oxygen saturations were monitored continuously. The                            was introduced through the mouth, and advanced to                            the third part of duodenum. The [id number]-[id number]                            Olympus was introduced through the mouth, and                            advanced to the third part of duodenum. The upper                            EUS was accomplished without difficulty. The                            patient tolerated the procedure well. Scope In: Scope Out: Findings:      Endoscopic findings (using radial echoendoscope and side viewing       duodenoscope): :      1. Limited views of esophagus and stomach were normal.      2. Soft 1.5cm subepithelial lesion in the duodenal bulb, about 1cm       distal to the major papilla.      EUS findings:      1. The subepithelial lesion noted above correlated with a 1.6cm mixed,       cystic/soft tissue lesion that invovled the deep mucosa and submucosal       layers of the duodenum. The lesion does not appear to invovle the       muscularis propria layer of the duodenum wall. I sampled with lesion       with tunnel biopsies using a side viewing duodenoscope following EUS       evaluation.      2. Limited views of the pancreas, CBD, liver, spleen were all normal.      3. No periportal adenopathy. Impression:               - Soft subepithelial lesion in the duodenum, about  1cm distal to the major papilla. Mixed cystic/soft                            tissue by EUS and it does not appear to involve the                            muscularis propria. I performed tunnel biospies                            following EUS evaluation. Moderate Sedation:      Not Applicable - Patient had care per Anesthesia. Recommendation:           - Discharge patient to home (ambulatory).                            -Await final biopsy results. Procedure Code(s):        --- Professional ---                           [phone], Esophagogastroduodenoscopy, flexible,                            transoral; with biopsy, single or multiple Diagnosis Code(s):        --- Professional ---                           K26.9, Duodenal ulcer, unspecified as acute or                            chronic, without hemorrhage or perforation                           K31.89, Other diseases of stomach and duodenum CPT copyright 2019 American Medical Association. All rights reserved. The codes documented in this report are preliminary and upon coder review may  be revised to meet current compliance requirements. Milus Banister, MD 04/12/2020 10:40:48 AM This report has been signed electronically. Number of Addenda: 0

## 2020-04-13 ENCOUNTER — Encounter: Payer: Self-pay | Admitting: *Deleted

## 2020-04-13 MED ORDER — LINACLOTIDE 290 MCG PO CAPS: 290.0000 ug | ORAL_CAPSULE | Freq: Every day | ORAL | 3 refills | Status: AC

## 2020-04-13 NOTE — Telephone Encounter (Signed)
Spoke with patient and he states that he did feel like Linzess did help in the past, but he is currently out of medication. Linzess 270mcg take one capsule daily was sent to pharmacy.  Patient aware and agreed to the plan. No further questions.

## 2020-04-13 NOTE — Telephone Encounter (Signed)
Ask if his linzess has helped, what dose he is on?  Can offer highest dose if he has not been taking that.    Thanks

## 2020-04-16 LAB — SURGICAL PATHOLOGY

## 2020-05-21 ENCOUNTER — Telehealth: Payer: Self-pay | Admitting: Gastroenterology

## 2020-05-21 NOTE — Telephone Encounter (Signed)
Patient reports that on Saturday after eating at [facility] he had vomiting and diarrhea.  This all resolved yesterday am.  He is advised that he needs to push fluids and drink Gatorade and water to replace the lost fluid. If he is not feeling any better tomorrow he is to call back for an office visit. He no longer has vomiting, nausea, diarrhea.   He verbalized understanding

## 2020-06-21 ENCOUNTER — Other Ambulatory Visit: Payer: Self-pay | Admitting: Internal Medicine

## 2020-06-22 ENCOUNTER — Ambulatory Visit (INDEPENDENT_AMBULATORY_CARE_PROVIDER_SITE_OTHER): Payer: Medicare HMO

## 2020-06-22 ENCOUNTER — Other Ambulatory Visit: Payer: Self-pay

## 2020-06-22 VITALS — BP 118/70 | HR 65 | Temp 98.3°F | Resp 16 | Ht 65.0 in | Wt 150.0 lb

## 2020-06-22 DIAGNOSIS — Z0000 Encounter for general adult medical examination without abnormal findings: Secondary | ICD-10-CM | POA: Diagnosis not present

## 2020-06-22 NOTE — Patient Instructions (Signed)
Brian Allen , Thank you for taking time to come for your Medicare Wellness Visit. I appreciate your ongoing commitment to your health goals. Please review the following plan we discussed and let me know if I can assist you in the future.   Screening recommendations/referrals: Colonoscopy: last done 06/13/2019; due every 3 years Recommended yearly ophthalmology/optometry visit for glaucoma screening and checkup Recommended yearly dental visit for hygiene and checkup  Vaccinations: Influenza vaccine: 01/13/2020 Pneumococcal vaccine: completed Tdap vaccine: 01/29/2010; overdue; due every 10 years Shingles vaccine: never done    Covid-19: completed  Advanced directives: Advance directive discussed with you today. Even though you declined this today please call our office should you change your mind and we can give you the proper paperwork for you to fill out.   Conditions/risks identified: Please continue to do your personal lifestyle choices by: daily care of teeth and gums, regular physical activity (goal should be 5 days a week for 30 minutes), eat a healthy diet, avoid tobacco and drug use, limiting any alcohol intake, taking a low-dose aspirin (if not allergic or have been advised by your provider otherwise) and taking vitamins and minerals as recommended by your provider. Continue doing brain stimulating activities (puzzles, reading, adult coloring books, staying active) to keep memory sharp. Continue to eat heart healthy diet (full of fruits, vegetables, whole grains, lean protein, water--limit salt, fat, and sugar intake) and increase physical activity as tolerated.  Next appointment: Please schedule your next Medicare Wellness Visit with your Nurse Health Advisor in 1 year.  Preventive Care 52 Years and Older, Male Preventive care refers to lifestyle choices and visits with your health care provider that can promote health and wellness. What does preventive care include?  A yearly physical  exam. This is also called an annual well check.  Dental exams once or twice a year.  Routine eye exams. Ask your health care provider how often you should have your eyes checked.  Personal lifestyle choices, including:  Daily care of your teeth and gums.  Regular physical activity.  Eating a healthy diet.  Avoiding tobacco and drug use.  Limiting alcohol use.  Practicing safe sex.  Taking low doses of aspirin every day.  Taking vitamin and mineral supplements as recommended by your health care provider. What happens during an annual well check? The services and screenings done by your health care provider during your annual well check will depend on your age, overall health, lifestyle risk factors, and family history of disease. Counseling  Your health care provider may ask you questions about your:  Alcohol use.  Tobacco use.  Drug use.  Emotional well-being.  Home and relationship well-being.  Sexual activity.  Eating habits.  History of falls.  Memory and ability to understand (cognition).  Work and work Statistician. Screening  You may have the following tests or measurements:  Height, weight, and BMI.  Blood pressure.  Lipid and cholesterol levels. These may be checked every 5 years, or more frequently if you are over 52 years old.  Skin check.  Lung cancer screening. You may have this screening every year starting at age 76 if you have a 30-pack-year history of smoking and currently smoke or have quit within the past 15 years.  Fecal occult blood test (FOBT) of the stool. You may have this test every year starting at age 28.  Flexible sigmoidoscopy or colonoscopy. You may have a sigmoidoscopy every 5 years or a colonoscopy every 10 years starting at age 34.  Prostate cancer screening. Recommendations will vary depending on your family history and other risks.  Hepatitis C blood test.  Hepatitis B blood test.  Sexually transmitted disease  (STD) testing.  Diabetes screening. This is done by checking your blood sugar (glucose) after you have not eaten for a while (fasting). You may have this done every 1-3 years.  Abdominal aortic aneurysm (AAA) screening. You may need this if you are a current or former smoker.  Osteoporosis. You may be screened starting at age 21 if you are at high risk. Talk with your health care provider about your test results, treatment options, and if necessary, the need for more tests. Vaccines  Your health care provider may recommend certain vaccines, such as:  Influenza vaccine. This is recommended every year.  Tetanus, diphtheria, and acellular pertussis (Tdap, Td) vaccine. You may need a Td booster every 10 years.  Zoster vaccine. You may need this after age 1.  Pneumococcal 13-valent conjugate (PCV13) vaccine. One dose is recommended after age 59.  Pneumococcal polysaccharide (PPSV23) vaccine. One dose is recommended after age 79. Talk to your health care provider about which screenings and vaccines you need and how often you need them. This information is not intended to replace advice given to you by your health care provider. Make sure you discuss any questions you have with your health care provider. Document Released: 01/11/2016 Document Revised: 09/03/2016 Document Reviewed: 10/16/2015 Elsevier Interactive Patient Education  2017 [employer] Prevention in the Home Falls can cause injuries. They can happen to people of all ages. There are many things you can do to make your home safe and to help prevent falls. What can I do on the outside of my home?  Regularly fix the edges of walkways and driveways and fix any cracks.  Remove anything that might make you trip as you walk through a door, such as a raised step or threshold.  Trim any bushes or trees on the path to your home.  Use bright outdoor lighting.  Clear any walking paths of anything that might make someone trip,  such as rocks or tools.  Regularly check to see if handrails are loose or broken. Make sure that both sides of any steps have handrails.  Any raised decks and porches should have guardrails on the edges.  Have any leaves, snow, or ice cleared regularly.  Use sand or salt on walking paths during winter.  Clean up any spills in your garage right away. This includes oil or grease spills. What can I do in the bathroom?  Use night lights.  Install grab bars by the toilet and in the tub and shower. Do not use towel bars as grab bars.  Use non-skid mats or decals in the tub or shower.  If you need to sit down in the shower, use a plastic, non-slip stool.  Keep the floor dry. Clean up any water that spills on the floor as soon as it happens.  Remove soap buildup in the tub or shower regularly.  Attach bath mats securely with double-sided non-slip rug tape.  Do not have throw rugs and other things on the floor that can make you trip. What can I do in the bedroom?  Use night lights.  Make sure that you have a light by your bed that is easy to reach.  Do not use any sheets or blankets that are too big for your bed. They should not hang down onto the floor.  Have  a firm chair that has side arms. You can use this for support while you get dressed.  Do not have throw rugs and other things on the floor that can make you trip. What can I do in the kitchen?  Clean up any spills right away.  Avoid walking on wet floors.  Keep items that you use a lot in easy-to-reach places.  If you need to reach something above you, use a strong step stool that has a grab bar.  Keep electrical cords out of the way.  Do not use floor polish or wax that makes floors slippery. If you must use wax, use non-skid floor wax.  Do not have throw rugs and other things on the floor that can make you trip. What can I do with my stairs?  Do not leave any items on the stairs.  Make sure that there are  handrails on both sides of the stairs and use them. Fix handrails that are broken or loose. Make sure that handrails are as long as the stairways.  Check any carpeting to make sure that it is firmly attached to the stairs. Fix any carpet that is loose or worn.  Avoid having throw rugs at the top or bottom of the stairs. If you do have throw rugs, attach them to the floor with carpet tape.  Make sure that you have a light switch at the top of the stairs and the bottom of the stairs. If you do not have them, ask someone to add them for you. What else can I do to help prevent falls?  Wear shoes that:  Do not have high heels.  Have rubber bottoms.  Are comfortable and fit you well.  Are closed at the toe. Do not wear sandals.  If you use a stepladder:  Make sure that it is fully opened. Do not climb a closed stepladder.  Make sure that both sides of the stepladder are locked into place.  Ask someone to hold it for you, if possible.  Clearly mark and make sure that you can see:  Any grab bars or handrails.  First and last steps.  Where the edge of each step is.  Use tools that help you move around (mobility aids) if they are needed. These include:  Canes.  Walkers.  Scooters.  Crutches.  Turn on the lights when you go into a dark area. Replace any light bulbs as soon as they burn out.  Set up your furniture so you have a clear path. Avoid moving your furniture around.  If any of your floors are uneven, fix them.  If there are any pets around you, be aware of where they are.  Review your medicines with your doctor. Some medicines can make you feel dizzy. This can increase your chance of falling. Ask your doctor what other things that you can do to help prevent falls. This information is not intended to replace advice given to you by your health care provider. Make sure you discuss any questions you have with your health care provider. Document Released: 10/11/2009  Document Revised: 05/22/2016 Document Reviewed: 01/19/2015 Elsevier Interactive Patient Education  2017 [employer].
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
## 2020-07-12 ENCOUNTER — Other Ambulatory Visit: Payer: Self-pay

## 2020-07-12 ENCOUNTER — Encounter: Payer: Self-pay | Admitting: Internal Medicine

## 2020-07-12 ENCOUNTER — Ambulatory Visit (INDEPENDENT_AMBULATORY_CARE_PROVIDER_SITE_OTHER): Payer: Medicare HMO | Admitting: Internal Medicine

## 2020-07-12 VITALS — BP 130/70 | HR 62 | Temp 98.6°F | Ht 65.0 in | Wt 150.0 lb

## 2020-07-12 DIAGNOSIS — M545 Low back pain: Secondary | ICD-10-CM | POA: Diagnosis not present

## 2020-07-12 DIAGNOSIS — I10 Essential (primary) hypertension: Secondary | ICD-10-CM

## 2020-07-12 DIAGNOSIS — E611 Iron deficiency: Secondary | ICD-10-CM | POA: Diagnosis not present

## 2020-07-12 DIAGNOSIS — G8929 Other chronic pain: Secondary | ICD-10-CM

## 2020-07-12 DIAGNOSIS — Z0001 Encounter for general adult medical examination with abnormal findings: Secondary | ICD-10-CM | POA: Diagnosis not present

## 2020-07-12 DIAGNOSIS — E785 Hyperlipidemia, unspecified: Secondary | ICD-10-CM | POA: Diagnosis not present

## 2020-07-12 DIAGNOSIS — E559 Vitamin D deficiency, unspecified: Secondary | ICD-10-CM | POA: Diagnosis not present

## 2020-07-12 DIAGNOSIS — D649 Anemia, unspecified: Secondary | ICD-10-CM | POA: Diagnosis not present

## 2020-07-12 DIAGNOSIS — K219 Gastro-esophageal reflux disease without esophagitis: Secondary | ICD-10-CM | POA: Diagnosis not present

## 2020-07-12 DIAGNOSIS — E538 Deficiency of other specified B group vitamins: Secondary | ICD-10-CM

## 2020-07-12 MED ORDER — GABAPENTIN 100 MG PO CAPS: 100.0000 mg | ORAL_CAPSULE | Freq: Three times a day (TID) | ORAL | 1 refills | Status: AC

## 2020-07-12 NOTE — Patient Instructions (Addendum)
Please take OTC Vitamin D3 at 2000 units per day, indefinitely.  Please continue all other medications as before, and refills have been done if requested - the gabapentin  Please have the pharmacy call with any other refills you may need.  Please continue your efforts at being more active, low cholesterol diet, and weight control.  You are otherwise up to date with prevention measures today.  Please keep your appointments with your specialists as you may have planned  Please make an Appointment to return in 6 months, or sooner if needed, also with Lab Appointment for testing done 3-5 days before at the [facility] (so this is for TWO appointments - please see the scheduling desk as you leave)
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
## 2020-07-13 ENCOUNTER — Other Ambulatory Visit: Payer: Self-pay | Admitting: Internal Medicine

## 2020-07-13 LAB — HEPATIC FUNCTION PANEL
AG Ratio: 1.6 (calc) (ref 1.0–2.5)
ALT: 13 U/L (ref 9–46)
AST: 14 U/L (ref 10–35)
Albumin: 4.3 g/dL (ref 3.6–5.1)
Alkaline phosphatase (APISO): 63 U/L (ref 35–144)
Bilirubin, Direct: 0.1 mg/dL (ref 0.0–0.2)
Globulin: 2.7 g/dL (calc) (ref 1.9–3.7)
Indirect Bilirubin: 0.3 mg/dL (calc) (ref 0.2–1.2)
Total Bilirubin: 0.4 mg/dL (ref 0.2–1.2)
Total Protein: 7 g/dL (ref 6.1–8.1)

## 2020-07-13 LAB — CBC WITH DIFFERENTIAL/PLATELET
Absolute Monocytes: 418 cells/uL (ref 200–950)
Basophils Absolute: 59 cells/uL (ref 0–200)
Basophils Relative: 1.6 %
Eosinophils Absolute: 107 cells/uL (ref 15–500)
Eosinophils Relative: 2.9 %
HCT: 38.2 % — ABNORMAL LOW (ref 38.5–50.0)
Hemoglobin: 12.7 g/dL — ABNORMAL LOW (ref 13.2–17.1)
Lymphs Abs: 1547 cells/uL (ref 850–3900)
MCH: 31 pg (ref 27.0–33.0)
MCHC: 33.2 g/dL (ref 32.0–36.0)
MCV: 93.2 fL (ref 80.0–100.0)
MPV: 10.5 fL (ref 7.5–12.5)
Monocytes Relative: 11.3 %
Neutro Abs: 1569 cells/uL (ref 1500–7800)
Neutrophils Relative %: 42.4 %
Platelets: 332 10*3/uL (ref 140–400)
RBC: 4.1 10*6/uL — ABNORMAL LOW (ref 4.20–5.80)
RDW: 13.7 % (ref 11.0–15.0)
Total Lymphocyte: 41.8 %
WBC: 3.7 10*3/uL — ABNORMAL LOW (ref 3.8–10.8)

## 2020-07-13 LAB — URINALYSIS, ROUTINE W REFLEX MICROSCOPIC
Bilirubin Urine: NEGATIVE
Glucose, UA: NEGATIVE
Hgb urine dipstick: NEGATIVE
Ketones, ur: NEGATIVE
Leukocytes,Ua: NEGATIVE
Nitrite: NEGATIVE
Protein, ur: NEGATIVE
Specific Gravity, Urine: 1.01 (ref 1.001–1.03)
pH: 5.5 (ref 5.0–8.0)

## 2020-07-13 LAB — BASIC METABOLIC PANEL
BUN: 16 mg/dL (ref 7–25)
CO2: 28 mmol/L (ref 20–32)
Calcium: 9.8 mg/dL (ref 8.6–10.3)
Chloride: 108 mmol/L (ref 98–110)
Creat: 0.95 mg/dL (ref 0.70–1.18)
Glucose, Bld: 90 mg/dL (ref 65–99)
Potassium: 4.7 mmol/L (ref 3.5–5.3)
Sodium: 142 mmol/L (ref 135–146)

## 2020-07-13 LAB — LIPID PANEL
Cholesterol: 284 mg/dL — ABNORMAL HIGH (ref <200)
HDL: 75 mg/dL (ref >=40)
LDL Cholesterol (Calc): 191 mg/dL (calc) — ABNORMAL HIGH
Non-HDL Cholesterol (Calc): 209 mg/dL (calc) — ABNORMAL HIGH (ref <130)
Total CHOL/HDL Ratio: 3.8 (calc) (ref <5.0)
Triglycerides: 75 mg/dL (ref <150)

## 2020-07-13 LAB — IRON, TOTAL/TOTAL IRON BINDING CAP
%SAT: 22 % (calc) (ref 20–48)
Iron: 90 ug/dL (ref 50–180)
TIBC: 406 mcg/dL (calc) (ref 250–425)

## 2020-07-13 LAB — VITAMIN D 25 HYDROXY (VIT D DEFICIENCY, FRACTURES): Vit D, 25-Hydroxy: 23 ng/mL — ABNORMAL LOW (ref 30–100)

## 2020-07-13 LAB — VITAMIN B12: Vitamin B-12: 455 pg/mL (ref 200–1100)

## 2020-07-13 LAB — TSH: TSH: 1.1 mIU/L (ref 0.40–4.50)

## 2020-07-13 LAB — FERRITIN: Ferritin: 10 ng/mL — ABNORMAL LOW (ref 24–380)

## 2020-07-13 LAB — PSA: PSA: <0.1 ng/mL (ref <=4.0)

## 2020-07-14 ENCOUNTER — Encounter: Payer: Self-pay | Admitting: Internal Medicine

## 2020-07-14 ENCOUNTER — Other Ambulatory Visit: Payer: Self-pay | Admitting: Internal Medicine

## 2020-07-14 DIAGNOSIS — M545 Low back pain, unspecified: Secondary | ICD-10-CM | POA: Insufficient documentation

## 2020-07-14 MED ORDER — VITAMIN D (ERGOCALCIFEROL) 1.25 MG (50000 UNIT) PO CAPS: 50000.0000 [IU] | ORAL_CAPSULE | ORAL | 0 refills | Status: DC

## 2020-07-14 MED ORDER — ATORVASTATIN CALCIUM 20 MG PO TABS: 20.0000 mg | ORAL_TABLET | Freq: Every day | ORAL | 3 refills | Status: DC

## 2020-07-14 NOTE — Assessment & Plan Note (Signed)
stable overall by history and exam, recent data reviewed with pt, and pt to continue medical treatment as before,  to f/u any worsening symptoms or concerns, for f/u lab 

## 2020-07-14 NOTE — Assessment & Plan Note (Signed)
For oral replacement 

## 2020-07-14 NOTE — Assessment & Plan Note (Signed)
stable overall by history and exam, recent data reviewed with pt, and pt to continue medical treatment as before,  to f/u any worsening symptoms or concerns  

## 2020-07-14 NOTE — Assessment & Plan Note (Signed)
To add gabapentin

## 2020-07-14 NOTE — Assessment & Plan Note (Addendum)
stable overall by history and exam, recent data reviewed with pt, and pt to continue medical treatment as before,  to f/u any worsening symptoms or concerns  I spent 31 minutes in preparing to see the patient by review of recent labs, imaging and procedures, obtaining and reviewing separately obtained history, communicating with the patient and family or caregiver, ordering medications, tests or procedures, and documenting clinical information in the EHR including the differential Dx, treatment, and any further evaluation and other management of anemia, htn, gerd, hld, vit d deficiency

## 2020-08-27 DIAGNOSIS — H04123 Dry eye syndrome of bilateral lacrimal glands: Secondary | ICD-10-CM | POA: Diagnosis not present

## 2020-08-27 DIAGNOSIS — Z961 Presence of intraocular lens: Secondary | ICD-10-CM | POA: Diagnosis not present

## 2020-08-27 DIAGNOSIS — H401132 Primary open-angle glaucoma, bilateral, moderate stage: Secondary | ICD-10-CM | POA: Diagnosis not present

## 2020-10-04 ENCOUNTER — Other Ambulatory Visit: Payer: Self-pay | Admitting: Internal Medicine

## 2020-10-11 ENCOUNTER — Telehealth: Payer: Self-pay | Admitting: Internal Medicine

## 2020-10-11 NOTE — Telephone Encounter (Signed)
Patient came to the office requesting a medication refill but he does not know the name of the medications. He said he has ran out of all of them. He states that the pharmacy told him that it was denied.   Please give the patient a call back: [phone]

## 2020-10-12 ENCOUNTER — Other Ambulatory Visit: Payer: Self-pay

## 2020-10-12 MED ORDER — AMLODIPINE BESYLATE 10 MG PO TABS: 10.0000 mg | ORAL_TABLET | Freq: Every day | ORAL | 1 refills | Status: DC

## 2020-10-12 MED ORDER — FESOTERODINE FUMARATE ER 4 MG PO TB24: 4.0000 mg | ORAL_TABLET | Freq: Every day | ORAL | 3 refills | Status: AC

## 2020-10-12 MED ORDER — LISINOPRIL 40 MG PO TABS
40.0000 mg | ORAL_TABLET | Freq: Every day | ORAL | 2 refills | Status: DC
Start: 2020-10-12 — End: 2021-04-30

## 2020-10-12 NOTE — Telephone Encounter (Signed)
Patient came in to the office again and brought the medications that need to be refilled:  amLODipine (NORVASC) 10 MG tablet lisinopril (ZESTRIL) 40 MG tablet  fesoterodine (TOVIAZ) 4 MG TB24 tablet   They can be sent to [facility] ([facility]), [place] - [address]   Please call the patient when rx is done: [phone]

## 2020-10-24 ENCOUNTER — Encounter: Payer: Self-pay | Admitting: Internal Medicine

## 2020-10-24 ENCOUNTER — Other Ambulatory Visit: Payer: Self-pay

## 2020-10-24 ENCOUNTER — Ambulatory Visit (INDEPENDENT_AMBULATORY_CARE_PROVIDER_SITE_OTHER): Payer: Medicare Other | Admitting: Internal Medicine

## 2020-10-24 VITALS — BP 150/82 | HR 65 | Temp 98.2°F | Ht 65.0 in | Wt 153.0 lb

## 2020-10-24 DIAGNOSIS — I10 Essential (primary) hypertension: Secondary | ICD-10-CM | POA: Diagnosis not present

## 2020-10-24 DIAGNOSIS — Z23 Encounter for immunization: Secondary | ICD-10-CM

## 2020-10-24 DIAGNOSIS — E559 Vitamin D deficiency, unspecified: Secondary | ICD-10-CM | POA: Diagnosis not present

## 2020-10-24 DIAGNOSIS — E611 Iron deficiency: Secondary | ICD-10-CM

## 2020-10-24 DIAGNOSIS — E785 Hyperlipidemia, unspecified: Secondary | ICD-10-CM

## 2020-10-24 LAB — LIPID PANEL
Cholesterol: 249 mg/dL — ABNORMAL HIGH (ref 0–200)
HDL: 64.8 mg/dL (ref >39.00)
LDL Cholesterol: 171 mg/dL — ABNORMAL HIGH (ref 0–99)
NonHDL: 183.82
Total CHOL/HDL Ratio: 4
Triglycerides: 66 mg/dL (ref 0.0–149.0)
VLDL: 13.2 mg/dL (ref 0.0–40.0)

## 2020-10-24 LAB — CBC WITH DIFFERENTIAL/PLATELET
Basophils Absolute: 0 10*3/uL (ref 0.0–0.1)
Basophils Relative: 0.9 % (ref 0.0–3.0)
Eosinophils Absolute: 0.1 10*3/uL (ref 0.0–0.7)
Eosinophils Relative: 3.4 % (ref 0.0–5.0)
HCT: 36.7 % — ABNORMAL LOW (ref 39.0–52.0)
Hemoglobin: 12.3 g/dL — ABNORMAL LOW (ref 13.0–17.0)
Lymphocytes Relative: 37 % (ref 12.0–46.0)
Lymphs Abs: 1.4 10*3/uL (ref 0.7–4.0)
MCHC: 33.5 g/dL (ref 30.0–36.0)
MCV: 91.7 fl (ref 78.0–100.0)
Monocytes Absolute: 0.4 10*3/uL (ref 0.1–1.0)
Monocytes Relative: 10.1 % (ref 3.0–12.0)
Neutro Abs: 1.8 10*3/uL (ref 1.4–7.7)
Neutrophils Relative %: 48.6 % (ref 43.0–77.0)
Platelets: 322 10*3/uL (ref 150.0–400.0)
RBC: 4 Mil/uL — ABNORMAL LOW (ref 4.22–5.81)
RDW: 14.4 % (ref 11.5–15.5)
WBC: 3.7 10*3/uL — ABNORMAL LOW (ref 4.0–10.5)

## 2020-10-24 LAB — HEPATIC FUNCTION PANEL
ALT: 12 U/L (ref 0–53)
AST: 17 U/L (ref 0–37)
Albumin: 4 g/dL (ref 3.5–5.2)
Alkaline Phosphatase: 52 U/L (ref 39–117)
Bilirubin, Direct: 0.1 mg/dL (ref 0.0–0.3)
Total Bilirubin: 0.4 mg/dL (ref 0.2–1.2)
Total Protein: 6.6 g/dL (ref 6.0–8.3)

## 2020-10-24 LAB — VITAMIN D 25 HYDROXY (VIT D DEFICIENCY, FRACTURES): VITD: 18.99 ng/mL — ABNORMAL LOW (ref 30.00–100.00)

## 2020-10-24 LAB — IBC PANEL
Iron: 69 ug/dL (ref 42–165)
Saturation Ratios: 18.5 % — ABNORMAL LOW (ref 20.0–50.0)
Transferrin: 267 mg/dL (ref 212.0–360.0)

## 2020-10-24 LAB — FERRITIN: Ferritin: 13.9 ng/mL — ABNORMAL LOW (ref 22.0–322.0)
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
## 2020-10-24 NOTE — Patient Instructions (Addendum)
You had the flu shot today  Please take OTC Vitamin D3 at 2000 units per day, indefinitely.  Please continue all other medications as before, and refills have been done if requested.  Please have the pharmacy call with any other refills you may need.  Please continue your efforts at being more active, low cholesterol diet, and weight control.  Please keep your appointments with your specialists as you may have planned  Please go to the LAB at the blood drawing area for the tests to be done  You will be contacted by phone if any changes need to be made immediately.  Otherwise, you will receive a letter about your results with an explanation, but please check with MyChart first.  Please remember to sign up for MyChart if you have not done so, as this will be important to you in the future with finding out test results, communicating by private email, and scheduling acute appointments online when needed.  Please make an Appointment to return in 6 months, or sooner if needed

## 2020-10-25 ENCOUNTER — Encounter: Payer: Self-pay | Admitting: Internal Medicine

## 2020-10-28 ENCOUNTER — Encounter: Payer: Self-pay | Admitting: Internal Medicine

## 2020-10-28 NOTE — Assessment & Plan Note (Signed)
For f/u labs on new statin, stable overall by history and exam, recent data reviewed with pt, and pt to continue medical treatment as before,  to f/u any worsening symptoms or concerns

## 2020-10-28 NOTE — Assessment & Plan Note (Signed)
To start vit d 2000 u qd ,  to f/u any worsening symptoms or concerns

## 2020-10-28 NOTE — Assessment & Plan Note (Signed)
No overt bleeding, for f/u lab

## 2020-10-28 NOTE — Assessment & Plan Note (Addendum)
stable overall by history and exam, recent data reviewed with pt, and pt to continue medical treatment as before,  to f/u any worsening symptoms or concerns, to check BP at home and call with results in 10 days  I spent 31 minutes in preparing to see the patient by review of recent labs, imaging and procedures, obtaining and reviewing separately obtained history, communicating with the patient and family or caregiver, ordering medications, tests or procedures, and documenting clinical information in the EHR including the differential Dx, treatment, and any further evaluation and other management of htn, hld, iron def, vit d def

## 2020-12-25 DIAGNOSIS — Z961 Presence of intraocular lens: Secondary | ICD-10-CM | POA: Diagnosis not present

## 2020-12-25 DIAGNOSIS — H401132 Primary open-angle glaucoma, bilateral, moderate stage: Secondary | ICD-10-CM | POA: Diagnosis not present

## 2020-12-25 DIAGNOSIS — H04123 Dry eye syndrome of bilateral lacrimal glands: Secondary | ICD-10-CM | POA: Diagnosis not present

## 2021-01-14 ENCOUNTER — Other Ambulatory Visit: Payer: Medicare HMO

## 2021-01-15 ENCOUNTER — Other Ambulatory Visit: Payer: Self-pay | Admitting: Internal Medicine

## 2021-01-15 NOTE — Telephone Encounter (Signed)
Please refill as per office routine med refill policy (all routine meds refilled for 3 mo or monthly per pt preference up to one year from last visit, then month to month grace period for 3 mo, then further med refills will have to be denied)  

## 2021-01-16 ENCOUNTER — Other Ambulatory Visit (INDEPENDENT_AMBULATORY_CARE_PROVIDER_SITE_OTHER): Payer: Medicare HMO

## 2021-01-16 ENCOUNTER — Other Ambulatory Visit: Payer: Medicare HMO

## 2021-01-16 ENCOUNTER — Other Ambulatory Visit: Payer: Self-pay

## 2021-01-16 DIAGNOSIS — E611 Iron deficiency: Secondary | ICD-10-CM | POA: Diagnosis not present

## 2021-01-16 DIAGNOSIS — D649 Anemia, unspecified: Secondary | ICD-10-CM

## 2021-01-16 LAB — IBC PANEL
Iron: 113 ug/dL (ref 42–165)
Saturation Ratios: 27.7 % (ref 20.0–50.0)
Transferrin: 291 mg/dL (ref 212.0–360.0)

## 2021-01-16 NOTE — Addendum Note (Signed)
Addended by: Raliegh Ip on: 01/16/2021 10:30 AM   Modules accepted: Orders

## 2021-01-17 ENCOUNTER — Other Ambulatory Visit: Payer: Self-pay

## 2021-01-17 ENCOUNTER — Encounter: Payer: Self-pay | Admitting: Internal Medicine

## 2021-01-18 ENCOUNTER — Encounter: Payer: Self-pay | Admitting: Internal Medicine

## 2021-01-18 ENCOUNTER — Ambulatory Visit (INDEPENDENT_AMBULATORY_CARE_PROVIDER_SITE_OTHER): Payer: Medicare HMO | Admitting: Internal Medicine

## 2021-01-18 ENCOUNTER — Ambulatory Visit: Payer: Medicare HMO | Admitting: Internal Medicine

## 2021-01-18 VITALS — BP 140/82 | HR 74 | Temp 98.6°F | Ht 65.0 in | Wt 157.0 lb

## 2021-01-18 DIAGNOSIS — N3281 Overactive bladder: Secondary | ICD-10-CM

## 2021-01-18 DIAGNOSIS — E611 Iron deficiency: Secondary | ICD-10-CM

## 2021-01-18 DIAGNOSIS — E7849 Other hyperlipidemia: Secondary | ICD-10-CM

## 2021-01-18 DIAGNOSIS — Z0000 Encounter for general adult medical examination without abnormal findings: Secondary | ICD-10-CM

## 2021-01-18 DIAGNOSIS — C61 Malignant neoplasm of prostate: Secondary | ICD-10-CM | POA: Diagnosis not present

## 2021-01-18 DIAGNOSIS — K219 Gastro-esophageal reflux disease without esophagitis: Secondary | ICD-10-CM | POA: Diagnosis not present

## 2021-01-18 DIAGNOSIS — D509 Iron deficiency anemia, unspecified: Secondary | ICD-10-CM

## 2021-01-18 DIAGNOSIS — I10 Essential (primary) hypertension: Secondary | ICD-10-CM

## 2021-01-18 DIAGNOSIS — Z0001 Encounter for general adult medical examination with abnormal findings: Secondary | ICD-10-CM

## 2021-01-18 DIAGNOSIS — E559 Vitamin D deficiency, unspecified: Secondary | ICD-10-CM | POA: Diagnosis not present

## 2021-01-18 LAB — CBC WITH DIFFERENTIAL/PLATELET
Basophils Absolute: 0.1 10*3/uL (ref 0.0–0.1)
Basophils Relative: 1.4 % (ref 0.0–3.0)
Eosinophils Absolute: 0.2 10*3/uL (ref 0.0–0.7)
Eosinophils Relative: 3.9 % (ref 0.0–5.0)
HCT: 38.2 % — ABNORMAL LOW (ref 39.0–52.0)
Hemoglobin: 12.9 g/dL — ABNORMAL LOW (ref 13.0–17.0)
Lymphocytes Relative: 33.4 % (ref 12.0–46.0)
Lymphs Abs: 1.7 10*3/uL (ref 0.7–4.0)
MCHC: 33.7 g/dL (ref 30.0–36.0)
MCV: 92.9 fl (ref 78.0–100.0)
Monocytes Absolute: 0.4 10*3/uL (ref 0.1–1.0)
Monocytes Relative: 8.8 % (ref 3.0–12.0)
Neutro Abs: 2.6 10*3/uL (ref 1.4–7.7)
Neutrophils Relative %: 52.5 % (ref 43.0–77.0)
Platelets: 316 10*3/uL (ref 150.0–400.0)
RBC: 4.11 Mil/uL — ABNORMAL LOW (ref 4.22–5.81)
RDW: 14.1 % (ref 11.5–15.5)
WBC: 5 10*3/uL (ref 4.0–10.5)

## 2021-01-18 LAB — URINALYSIS, ROUTINE W REFLEX MICROSCOPIC
Bilirubin Urine: NEGATIVE
Hgb urine dipstick: NEGATIVE
Ketones, ur: NEGATIVE
Leukocytes,Ua: NEGATIVE
Nitrite: NEGATIVE
RBC / HPF: NONE SEEN (ref 0–?)
Specific Gravity, Urine: 1.015 (ref 1.000–1.030)
Total Protein, Urine: NEGATIVE
Urine Glucose: NEGATIVE
Urobilinogen, UA: 0.2 (ref 0.0–1.0)
WBC, UA: NONE SEEN (ref 0–?)
pH: 7 (ref 5.0–8.0)

## 2021-01-18 LAB — HEPATIC FUNCTION PANEL
ALT: 16 U/L (ref 0–53)
AST: 16 U/L (ref 0–37)
Albumin: 4.3 g/dL (ref 3.5–5.2)
Alkaline Phosphatase: 55 U/L (ref 39–117)
Bilirubin, Direct: 0.1 mg/dL (ref 0.0–0.3)
Total Bilirubin: 0.4 mg/dL (ref 0.2–1.2)
Total Protein: 6.8 g/dL (ref 6.0–8.3)

## 2021-01-18 LAB — BASIC METABOLIC PANEL
BUN: 15 mg/dL (ref 6–23)
CO2: 29 mEq/L (ref 19–32)
Calcium: 9.7 mg/dL (ref 8.4–10.5)
Chloride: 105 mEq/L (ref 96–112)
Creatinine, Ser: 0.96 mg/dL (ref 0.40–1.50)
GFR: 77.02 mL/min (ref >60.00)
Glucose, Bld: 87 mg/dL (ref 70–99)
Potassium: 4.3 mEq/L (ref 3.5–5.1)
Sodium: 140 mEq/L (ref 135–145)

## 2021-01-18 LAB — LIPID PANEL
Cholesterol: 288 mg/dL — ABNORMAL HIGH (ref 0–200)
HDL: 68.8 mg/dL (ref >39.00)
NonHDL: 218.73
Total CHOL/HDL Ratio: 4
Triglycerides: 209 mg/dL — ABNORMAL HIGH (ref 0.0–149.0)
VLDL: 41.8 mg/dL — ABNORMAL HIGH (ref 0.0–40.0)

## 2021-01-18 LAB — TSH: TSH: 1.05 u[IU]/mL (ref 0.35–4.50)

## 2021-01-18 LAB — PSA: PSA: 0.07 ng/mL — ABNORMAL LOW (ref 0.10–4.00)

## 2021-01-18 LAB — LDL CHOLESTEROL, DIRECT: Direct LDL: 181 mg/dL

## 2021-01-18 LAB — FERRITIN: Ferritin: 13.2 ng/mL — ABNORMAL LOW (ref 22.0–322.0)

## 2021-01-18 LAB — VITAMIN D 25 HYDROXY (VIT D DEFICIENCY, FRACTURES): VITD: 20.99 ng/mL — ABNORMAL LOW (ref 30.00–100.00)

## 2021-01-18 MED ORDER — SOLIFENACIN SUCCINATE 5 MG PO TABS: 5.0000 mg | ORAL_TABLET | Freq: Every day | ORAL | 3 refills | Status: DC
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
## 2021-01-18 NOTE — Patient Instructions (Signed)
You had the Tdap tetanus shot today  Please take all new medication as prescribed - the vesicare 5 mg per day for the bladder (since the Lisbeth Ply is too expensive but did help when you were taking it);  If the vesicare is too espensive, please ASK at the pharmacy about which medication LIKE the toviaz and vesicare IS covered with your insurance, and then we can change to that  Please take OTC Vitamin D3 at 2000 units per day, indefinitely.  Please continue all other medications as before, and refills have been done if requested.  Please have the pharmacy call with any other refills you may need.  Please continue your efforts at being more active, low cholesterol diet, and weight control.  You are otherwise up to date with prevention measures today.  Please keep your appointments with your specialists as you may have planned  Please go to the LAB at the blood drawing area for the tests to be done  You will be contacted by phone if any changes need to be made immediately.  Otherwise, you will receive a letter about your results with an explanation, but please check with MyChart first.  Please remember to sign up for MyChart if you have not done so, as this will be important to you in the future with finding out test results, communicating by private email, and scheduling acute appointments online when needed.  Please make an Appointment to return in 6 months, or sooner if needed

## 2021-01-20 ENCOUNTER — Encounter: Payer: Self-pay | Admitting: Internal Medicine

## 2021-01-20 NOTE — Assessment & Plan Note (Signed)
Also for f/u psa, o/w asympt

## 2021-01-20 NOTE — Assessment & Plan Note (Signed)
Also for f/u iron lab ?

## 2021-01-20 NOTE — Assessment & Plan Note (Signed)
BP Readings from Last 3 Encounters:  01/18/21 140/82  10/24/20 (!) 150/82  07/12/20 130/70   Stable, pt to continue medical treatment norvasc, coreg, lisinopril

## 2021-01-20 NOTE — Assessment & Plan Note (Signed)
Asympt, for f/u lab today 

## 2021-01-20 NOTE — Assessment & Plan Note (Signed)
Lab Results  Component Value Date   LDLCALC 171 (H) 10/24/2020   Stable, pt to continue current statin lipitor 80   Current Outpatient Medications (Cardiovascular):  .  amLODipine (NORVASC) 10 MG tablet, Take 1 tablet (10 mg total) by mouth daily. Marland Kitchen  atorvastatin (LIPITOR) 80 MG tablet, SMARTSIG:1 Tablet(s) By Mouth Every Evening .  carvedilol (COREG) 3.125 MG tablet, TAKE 1 TABLET BY MOUTH TWICE DAILY WITH A MEAL .  lisinopril (ZESTRIL) 40 MG tablet, Take 1 tablet (40 mg total) by mouth daily. .  sildenafil (VIAGRA) 100 MG tablet, Take 0.5-1 tablets (50-100 mg total) by mouth daily as needed for erectile dysfunction.   Current Outpatient Medications (Analgesics):  .  acetaminophen (TYLENOL) 500 MG tablet, Take 1 tablet (500 mg total) by mouth every 6 (six) hours as needed. Marland Kitchen  aspirin 81 MG tablet, Take 81 mg by mouth daily.   Current Outpatient Medications (Other):  .  dorzolamide-timolol (COSOPT) 22.3-6.8 MG/ML ophthalmic solution, 1 drop 2 (two) times daily. .  fesoterodine (TOVIAZ) 4 MG TB24 tablet, Take 1 tablet (4 mg total) by mouth daily. Marland Kitchen  gabapentin (NEURONTIN) 100 MG capsule, Take 1 capsule (100 mg total) by mouth 3 (three) times daily. Marland Kitchen  latanoprost (XALATAN) 0.005 % ophthalmic solution, Place 1 drop into both eyes at bedtime. Marland Kitchen  linaclotide (LINZESS) 290 MCG CAPS capsule, Take 1 capsule (290 mcg total) by mouth daily before breakfast. .  solifenacin (VESICARE) 5 MG tablet, Take 1 tablet (5 mg total) by mouth daily. .  sucralfate (CARAFATE) 1 GM/10ML suspension, Take 10 mLs (1 g total) by mouth at bedtime. .  timolol (TIMOPTIC) 0.5 % ophthalmic solution, Place 1 drop into both eyes daily.

## 2021-01-20 NOTE — Assessment & Plan Note (Signed)
Brian Allen too expensive, ok for vesicare 5 qd, or similar that is covered by insurance

## 2021-01-20 NOTE — Assessment & Plan Note (Signed)
Last vitamin D Lab Results  Component Value Date   VD25OH 20.99 (L) 01/18/2021   Stable, cont oral replacement

## 2021-01-20 NOTE — Assessment & Plan Note (Addendum)
Stable, cont current tx - tums prn

## 2021-01-20 NOTE — Assessment & Plan Note (Signed)

## 2021-03-29 ENCOUNTER — Telehealth: Payer: Self-pay

## 2021-03-29 NOTE — Telephone Encounter (Signed)
Per recall pt needs EUS r/l next available.  Duodenal lesion with Dr Ardis Hughs

## 2021-04-01 ENCOUNTER — Other Ambulatory Visit: Payer: Self-pay

## 2021-04-01 DIAGNOSIS — K319 Disease of stomach and duodenum, unspecified: Secondary | ICD-10-CM

## 2021-04-01 NOTE — Telephone Encounter (Signed)
EUS scheduled with Dr Ardis Hughs on 05/16/21 at [facility] test on 5/16

## 2021-04-01 NOTE — Telephone Encounter (Signed)
Tried to reach pt and voice mail has not been set up.  Information has been mailed to the pt. I will try to call him tomorrow.

## 2021-04-02 NOTE — Telephone Encounter (Signed)
EUS scheduled, pt instructed and medications reviewed.  Patient instructions mailed to home.  Patient to call with any questions or concerns.  

## 2021-04-24 ENCOUNTER — Other Ambulatory Visit: Payer: Self-pay | Admitting: Internal Medicine

## 2021-04-24 ENCOUNTER — Ambulatory Visit: Payer: Medicare Other | Admitting: Internal Medicine

## 2021-04-24 DIAGNOSIS — Z0289 Encounter for other administrative examinations: Secondary | ICD-10-CM

## 2021-04-24 NOTE — Telephone Encounter (Signed)
Please refill as per office routine med refill policy (all routine meds refilled for 3 mo or monthly per pt preference up to one year from last visit, then month to month grace period for 3 mo, then further med refills will have to be denied)  

## 2021-04-30 ENCOUNTER — Ambulatory Visit (INDEPENDENT_AMBULATORY_CARE_PROVIDER_SITE_OTHER): Payer: Medicare HMO | Admitting: Internal Medicine

## 2021-04-30 ENCOUNTER — Encounter: Payer: Self-pay | Admitting: Internal Medicine

## 2021-04-30 ENCOUNTER — Other Ambulatory Visit: Payer: Self-pay

## 2021-04-30 VITALS — BP 148/70 | HR 53 | Temp 97.9°F | Ht 65.0 in | Wt 155.6 lb

## 2021-04-30 DIAGNOSIS — E7800 Pure hypercholesterolemia, unspecified: Secondary | ICD-10-CM | POA: Diagnosis not present

## 2021-04-30 DIAGNOSIS — E559 Vitamin D deficiency, unspecified: Secondary | ICD-10-CM

## 2021-04-30 DIAGNOSIS — I10 Essential (primary) hypertension: Secondary | ICD-10-CM

## 2021-04-30 DIAGNOSIS — N3281 Overactive bladder: Secondary | ICD-10-CM | POA: Diagnosis not present

## 2021-04-30 MED ORDER — AMLODIPINE BESYLATE 10 MG PO TABS: 1.0000 | ORAL_TABLET | Freq: Every day | ORAL | 2 refills | Status: AC

## 2021-04-30 MED ORDER — ATORVASTATIN CALCIUM 80 MG PO TABS: ORAL_TABLET | ORAL | 2 refills | Status: AC

## 2021-04-30 MED ORDER — CARVEDILOL 3.125 MG PO TABS: 3.1250 mg | ORAL_TABLET | Freq: Two times a day (BID) | ORAL | 2 refills | Status: AC

## 2021-04-30 MED ORDER — LISINOPRIL 40 MG PO TABS: 40.0000 mg | ORAL_TABLET | Freq: Every day | ORAL | 2 refills | Status: AC

## 2021-04-30 NOTE — Patient Instructions (Addendum)
Please continue all other medications as before, and refills have been done if requested - the blood pressure meds  Please have the pharmacy call with any other refills you may need.  Please continue your efforts at being more active, low cholesterol diet, and weight control.  Please keep your appointments with your specialists as you may have planned  Please make an Appointment to return in 3 months
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
## 2021-05-03 DIAGNOSIS — Z961 Presence of intraocular lens: Secondary | ICD-10-CM | POA: Diagnosis not present

## 2021-05-03 DIAGNOSIS — H401132 Primary open-angle glaucoma, bilateral, moderate stage: Secondary | ICD-10-CM | POA: Diagnosis not present

## 2021-05-03 DIAGNOSIS — H04123 Dry eye syndrome of bilateral lacrimal glands: Secondary | ICD-10-CM | POA: Diagnosis not present

## 2021-05-07 ENCOUNTER — Encounter: Payer: Self-pay | Admitting: Internal Medicine

## 2021-05-07 NOTE — Assessment & Plan Note (Signed)
Improved, stable,  to f/u any worsening symptoms or concerns  

## 2021-05-07 NOTE — Assessment & Plan Note (Signed)
Lab Results  Component Value Date   LDLCALC 171 (H) 10/24/2020   Stable, pt to continue current statin lipito 80 - and f/u lab

## 2021-05-07 NOTE — Assessment & Plan Note (Signed)
Last vitamin D Lab Results  Component Value Date   VD25OH 20.99 (L) 01/18/2021   Low, to start oral replacement

## 2021-05-07 NOTE — Assessment & Plan Note (Signed)
Mild uncontrolled, to restart home bp meds - norvasc, coreg

## 2021-05-13 ENCOUNTER — Other Ambulatory Visit: Payer: Self-pay

## 2021-05-13 ENCOUNTER — Emergency Department (HOSPITAL_COMMUNITY): Payer: Medicare HMO

## 2021-05-13 ENCOUNTER — Encounter (HOSPITAL_COMMUNITY): Payer: Self-pay

## 2021-05-13 ENCOUNTER — Other Ambulatory Visit (HOSPITAL_COMMUNITY): Payer: Medicare HMO

## 2021-05-13 ENCOUNTER — Emergency Department (HOSPITAL_COMMUNITY)
Admission: EM | Admit: 2021-05-13 | Discharge: 2021-05-13 | Disposition: A | Discharge status: Home / Self Care | Payer: Medicare HMO | Attending: Emergency Medicine | Admitting: Emergency Medicine

## 2021-05-13 DIAGNOSIS — K5900 Constipation, unspecified: Secondary | ICD-10-CM | POA: Diagnosis not present

## 2021-05-13 DIAGNOSIS — I10 Essential (primary) hypertension: Secondary | ICD-10-CM | POA: Insufficient documentation

## 2021-05-13 DIAGNOSIS — J069 Acute upper respiratory infection, unspecified: Secondary | ICD-10-CM | POA: Diagnosis not present

## 2021-05-13 DIAGNOSIS — Z79899 Other long term (current) drug therapy: Secondary | ICD-10-CM | POA: Insufficient documentation

## 2021-05-13 DIAGNOSIS — R410 Disorientation, unspecified: Secondary | ICD-10-CM | POA: Diagnosis not present

## 2021-05-13 DIAGNOSIS — Z20822 Contact with and (suspected) exposure to covid-19: Secondary | ICD-10-CM | POA: Insufficient documentation

## 2021-05-13 DIAGNOSIS — Z87891 Personal history of nicotine dependence: Secondary | ICD-10-CM | POA: Insufficient documentation

## 2021-05-13 DIAGNOSIS — Z8546 Personal history of malignant neoplasm of prostate: Secondary | ICD-10-CM | POA: Insufficient documentation

## 2021-05-13 DIAGNOSIS — R519 Headache, unspecified: Secondary | ICD-10-CM | POA: Diagnosis not present

## 2021-05-13 DIAGNOSIS — Z7982 Long term (current) use of aspirin: Secondary | ICD-10-CM | POA: Diagnosis not present

## 2021-05-13 DIAGNOSIS — R29810 Facial weakness: Secondary | ICD-10-CM | POA: Diagnosis not present

## 2021-05-13 DIAGNOSIS — R110 Nausea: Secondary | ICD-10-CM | POA: Diagnosis not present

## 2021-05-13 DIAGNOSIS — B349 Viral infection, unspecified: Secondary | ICD-10-CM

## 2021-05-13 DIAGNOSIS — S2231XA Fracture of one rib, right side, initial encounter for closed fracture: Secondary | ICD-10-CM | POA: Diagnosis not present

## 2021-05-13 LAB — COMPREHENSIVE METABOLIC PANEL
ALT: 17 U/L (ref 0–44)
AST: 21 U/L (ref 15–41)
Albumin: 3.8 g/dL (ref 3.5–5.0)
Alkaline Phosphatase: 68 U/L (ref 38–126)
Anion gap: 6 (ref 5–15)
BUN: 14 mg/dL (ref 8–23)
CO2: 26 mmol/L (ref 22–32)
Calcium: 9.3 mg/dL (ref 8.9–10.3)
Chloride: 107 mmol/L (ref 98–111)
Creatinine, Ser: 0.86 mg/dL (ref 0.61–1.24)
GFR, Estimated: >60 mL/min (ref >60)
Glucose, Bld: 124 mg/dL — ABNORMAL HIGH (ref 70–99)
Potassium: 3.9 mmol/L (ref 3.5–5.1)
Sodium: 139 mmol/L (ref 135–145)
Total Bilirubin: 0.3 mg/dL (ref 0.3–1.2)
Total Protein: 7.4 g/dL (ref 6.5–8.1)

## 2021-05-13 LAB — CBC WITH DIFFERENTIAL/PLATELET
Abs Immature Granulocytes: 0.05 10*3/uL (ref 0.00–0.07)
Basophils Absolute: 0 10*3/uL (ref 0.0–0.1)
Basophils Relative: 0 %
Eosinophils Absolute: 0.1 10*3/uL (ref 0.0–0.5)
Eosinophils Relative: 2 %
HCT: 34.7 % — ABNORMAL LOW (ref 39.0–52.0)
Hemoglobin: 11.4 g/dL — ABNORMAL LOW (ref 13.0–17.0)
Immature Granulocytes: 1 %
Lymphocytes Relative: 13 %
Lymphs Abs: 0.9 10*3/uL (ref 0.7–4.0)
MCH: 31.4 pg (ref 26.0–34.0)
MCHC: 32.9 g/dL (ref 30.0–36.0)
MCV: 95.6 fL (ref 80.0–100.0)
Monocytes Absolute: 0.3 10*3/uL (ref 0.1–1.0)
Monocytes Relative: 5 %
Neutro Abs: 5.5 10*3/uL (ref 1.7–7.7)
Neutrophils Relative %: 79 %
Platelets: 305 10*3/uL (ref 150–400)
RBC: 3.63 MIL/uL — ABNORMAL LOW (ref 4.22–5.81)
RDW: 13.4 % (ref 11.5–15.5)
WBC: 6.9 10*3/uL (ref 4.0–10.5)
nRBC: 0 % (ref 0.0–0.2)

## 2021-05-13 LAB — RESP PANEL BY RT-PCR (FLU A&B, COVID) ARPGX2
Influenza A by PCR: NEGATIVE
Influenza B by PCR: NEGATIVE
SARS Coronavirus 2 by RT PCR: NEGATIVE

## 2021-05-13 MED ORDER — DIPHENHYDRAMINE HCL 50 MG/ML IJ SOLN
25.0000 mg | Freq: Once | INTRAMUSCULAR | Status: AC
  Administered 2021-05-13: 25 mg via INTRAVENOUS
  Filled 2021-05-13: qty 1

## 2021-05-13 MED ORDER — PROCHLORPERAZINE EDISYLATE 10 MG/2ML IJ SOLN
10.0000 mg | Freq: Once | INTRAMUSCULAR | Status: AC
  Administered 2021-05-13: 10 mg via INTRAVENOUS
  Filled 2021-05-13: qty 2
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
## 2021-05-13 NOTE — ED Triage Notes (Addendum)
C/o headache woke up at 0300.Pt wife states he has not been himself for a week. Pt unsure if he has been taking his meds at prescribed. All BP med bottles were full and filled last year per EMS. EMS gave 4mg  zofran

## 2021-05-17 ENCOUNTER — Telehealth: Payer: Self-pay | Admitting: Gastroenterology

## 2021-05-17 NOTE — Telephone Encounter (Signed)
Patients grandson called to get the Covid test rescheduled for patient. Ask that we call him or patients wife since she is the one on DPR.

## 2021-05-17 NOTE — Telephone Encounter (Signed)
Left message on machine to call back  

## 2021-05-17 NOTE — Telephone Encounter (Signed)
The pt grandson has been advised that no COVID test is needed at this time due to changes in requirements. The pt has been advised of the information and verbalized understanding.

## 2021-06-18 DIAGNOSIS — N3281 Overactive bladder: Secondary | ICD-10-CM | POA: Diagnosis not present

## 2021-06-18 DIAGNOSIS — E785 Hyperlipidemia, unspecified: Secondary | ICD-10-CM | POA: Diagnosis not present

## 2021-06-18 DIAGNOSIS — F17211 Nicotine dependence, cigarettes, in remission: Secondary | ICD-10-CM | POA: Diagnosis not present

## 2021-06-18 DIAGNOSIS — E663 Overweight: Secondary | ICD-10-CM | POA: Diagnosis not present

## 2021-06-18 DIAGNOSIS — C61 Malignant neoplasm of prostate: Secondary | ICD-10-CM | POA: Diagnosis not present

## 2021-06-18 DIAGNOSIS — D649 Anemia, unspecified: Secondary | ICD-10-CM | POA: Diagnosis not present

## 2021-06-18 DIAGNOSIS — I10 Essential (primary) hypertension: Secondary | ICD-10-CM | POA: Diagnosis not present

## 2021-06-18 DIAGNOSIS — Z79899 Other long term (current) drug therapy: Secondary | ICD-10-CM | POA: Diagnosis not present

## 2021-06-18 DIAGNOSIS — I358 Other nonrheumatic aortic valve disorders: Secondary | ICD-10-CM | POA: Diagnosis not present

## 2021-06-24 NOTE — Progress Notes (Signed)
Attempted to obtain medical history via telephone, unable to reach at this time. I left a voicemail to return pre surgical testing department's phone call.  

## 2021-06-25 ENCOUNTER — Other Ambulatory Visit: Payer: Self-pay

## 2021-06-27 ENCOUNTER — Ambulatory Visit (HOSPITAL_COMMUNITY): Payer: Medicare HMO | Admitting: Anesthesiology

## 2021-06-27 ENCOUNTER — Encounter (HOSPITAL_COMMUNITY): Payer: Self-pay | Admitting: Gastroenterology

## 2021-06-27 ENCOUNTER — Encounter (HOSPITAL_COMMUNITY)
Admission: RE | Disposition: A | Discharge status: Home / Self Care | Payer: Self-pay | Source: Home / Self Care | Attending: Gastroenterology

## 2021-06-27 ENCOUNTER — Ambulatory Visit (HOSPITAL_COMMUNITY)
Admission: RE | Admit: 2021-06-27 | Discharge: 2021-06-27 | Disposition: A | Discharge status: Home / Self Care | Payer: Medicare HMO | Attending: Gastroenterology | Admitting: Gastroenterology

## 2021-06-27 ENCOUNTER — Other Ambulatory Visit: Payer: Self-pay

## 2021-06-27 DIAGNOSIS — E559 Vitamin D deficiency, unspecified: Secondary | ICD-10-CM | POA: Diagnosis not present

## 2021-06-27 DIAGNOSIS — R12 Heartburn: Secondary | ICD-10-CM | POA: Diagnosis not present

## 2021-06-27 DIAGNOSIS — K319 Disease of stomach and duodenum, unspecified: Secondary | ICD-10-CM | POA: Diagnosis present

## 2021-06-27 DIAGNOSIS — Z87891 Personal history of nicotine dependence: Secondary | ICD-10-CM | POA: Diagnosis not present

## 2021-06-27 DIAGNOSIS — Z888 Allergy status to other drugs, medicaments and biological substances status: Secondary | ICD-10-CM | POA: Insufficient documentation

## 2021-06-27 DIAGNOSIS — E785 Hyperlipidemia, unspecified: Secondary | ICD-10-CM | POA: Diagnosis not present

## 2021-06-27 DIAGNOSIS — K3189 Other diseases of stomach and duodenum: Secondary | ICD-10-CM

## 2021-06-27 DIAGNOSIS — K219 Gastro-esophageal reflux disease without esophagitis: Secondary | ICD-10-CM | POA: Insufficient documentation

## 2021-06-27 DIAGNOSIS — Z8546 Personal history of malignant neoplasm of prostate: Secondary | ICD-10-CM | POA: Diagnosis not present

## 2021-06-27 HISTORY — PX: EUS: SHX5427

## 2021-06-27 HISTORY — PX: ESOPHAGOGASTRODUODENOSCOPY: SHX5428

## 2021-06-27 SURGERY — UPPER ENDOSCOPIC ULTRASOUND (EUS) RADIAL
Anesthesia: Monitor Anesthesia Care

## 2021-06-27 MED ORDER — PROPOFOL 10 MG/ML IV BOLUS
INTRAVENOUS | Status: DC | PRN
  Administered 2021-06-27: 20 mg via INTRAVENOUS
  Administered 2021-06-27: 10 mg via INTRAVENOUS
  Administered 2021-06-27: 30 mg via INTRAVENOUS

## 2021-06-27 MED ORDER — PROPOFOL 500 MG/50ML IV EMUL
INTRAVENOUS | Status: DC | PRN
  Administered 2021-06-27: 150 ug/kg/min via INTRAVENOUS

## 2021-06-27 MED ORDER — LIDOCAINE 2% (20 MG/ML) 5 ML SYRINGE
INTRAMUSCULAR | Status: DC | PRN
  Administered 2021-06-27: 80 mg via INTRAVENOUS

## 2021-06-27 MED ORDER — LACTATED RINGERS IV SOLN: INTRAVENOUS | Status: DC

## 2021-06-27 MED ORDER — OMEPRAZOLE 40 MG PO CPDR: 40.0000 mg | DELAYED_RELEASE_CAPSULE | Freq: Every day | ORAL | 11 refills | Status: AC

## 2021-06-27 MED ORDER — SODIUM CHLORIDE 0.9 % IV SOLN: INTRAVENOUS | Status: DC

## 2021-06-27 NOTE — Anesthesia Preprocedure Evaluation (Signed)
Anesthesia Evaluation  Patient identified by MRN, date of birth, ID band Patient awake    Reviewed: Allergy & Precautions, NPO status , Patient's Chart, lab work & pertinent test results  Airway Mallampati: II  TM Distance: >3 FB Neck ROM: Full    Dental no notable dental hx.    Pulmonary neg pulmonary ROS, former smoker,    Pulmonary exam normal breath sounds clear to auscultation       Cardiovascular hypertension, Pt. on medications and Pt. on home beta blockers Normal cardiovascular exam Rhythm:Regular Rate:Normal     Neuro/Psych negative neurological ROS  negative psych ROS   GI/Hepatic Neg liver ROS, GERD  Medicated,  Endo/Other  negative endocrine ROS  Renal/GU negative Renal ROS  negative genitourinary   Musculoskeletal negative musculoskeletal ROS (+)   Abdominal   Peds negative pediatric ROS (+)  Hematology negative hematology ROS (+)   Anesthesia Other Findings   Reproductive/Obstetrics negative OB ROS                             Anesthesia Physical Anesthesia Plan  ASA: 2  Anesthesia Plan: MAC   Post-op Pain Management:    Induction: Intravenous  PONV Risk Score and Plan: 1 and Propofol infusion and Treatment may vary due to age or medical condition  Airway Management Planned: Simple Face Mask  Additional Equipment:   Intra-op Plan:   Post-operative Plan:   Informed Consent: I have reviewed the patients History and Physical, chart, labs and discussed the procedure including the risks, benefits and alternatives for the proposed anesthesia with the patient or authorized representative who has indicated his/her understanding and acceptance.     Dental advisory given  Plan Discussed with: CRNA and Surgeon  Anesthesia Plan Comments:         Anesthesia Quick Evaluation

## 2021-06-27 NOTE — Anesthesia Postprocedure Evaluation (Signed)
Anesthesia Post Note  Patient: Brian Allen.  Procedure(s) Performed: UPPER ENDOSCOPIC ULTRASOUND (EUS) RADIAL ESOPHAGOGASTRODUODENOSCOPY (EGD)     Patient location during evaluation: PACU Anesthesia Type: MAC Level of consciousness: awake and alert Pain management: pain level controlled Vital Signs Assessment: post-procedure vital signs reviewed and stable Respiratory status: spontaneous breathing, nonlabored ventilation, respiratory function stable and patient connected to nasal cannula oxygen Cardiovascular status: stable and blood pressure returned to baseline Postop Assessment: no apparent nausea or vomiting Anesthetic complications: no   No notable events documented.  Last Vitals:  Vitals:   06/27/21 0950 06/27/21 1000  BP: 122/60 (!) 143/79  Pulse: 61 61  Resp: (!) 21 (!) 21  Temp:    SpO2: 95% 96%    Last Pain:  Vitals:   06/27/21 1000  TempSrc:   PainSc: 0-No pain                 Eyvette Cordon S

## 2021-06-27 NOTE — Op Note (Signed)
[address] [facility] Patient Name: Brian Allen Procedure Date: 06/27/2021 MRN: [id number] Attending MD: Milus Banister , MD Date of Birth: 1946-01-06 CSN: [id number] Age: 75 Admit Type: Outpatient Procedure:                Upper EUS Indications:              Duodenal mucosal mass/polyp found on endoscopy,EGD                            2021 incidental subepithelial lesion of the                            duodenum. EUS 03/2020 1.6cm mixed cystic/solid                            'lesion' of the duodenum wall confined to the deep                            mucosa and submucosal layers (MP not involved), FNA                            suggested Brunners gland hyperplasia. Here for                            surveillance; only symptom today is GERD. Providers:                Milus Banister, MD, Josie Dixon, RN, Lesia Sago, Technician Referring MD:             Jolly Mango, MD Medicines:                Monitored Anesthesia Care Complications:            No immediate complications. Estimated blood loss:                            None. Estimated Blood Loss:     Estimated blood loss: none. Procedure:                Pre-Anesthesia Assessment:                           - Prior to the procedure, a History and Physical                            was performed, and patient medications and                            allergies were reviewed. The patient's tolerance of                            previous anesthesia was also reviewed. The risks                            and  benefits of the procedure and the sedation                            options and risks were discussed with the patient.                            All questions were answered, and informed consent                            was obtained. Prior Anticoagulants: The patient has                            taken no previous anticoagulant or antiplatelet                             agents. ASA Grade Assessment: II - A patient with                            mild systemic disease. After reviewing the risks                            and benefits, the patient was deemed in                            satisfactory condition to undergo the procedure.                           After obtaining informed consent, the endoscope was                            passed under direct vision. Throughout the                            procedure, the patient's blood pressure, pulse, and                            oxygen saturations were monitored continuously. The                            [id number] ([id number] Olympus Radial EUS was                            introduced through the mouth, and advanced to the                            second part of duodenum. The upper EUS was                            accomplished without difficulty. The patient                            tolerated the procedure well. After obtaining  informed consent, the endoscope was passed under                            direct vision. Throughout the procedure, the                            patient's blood pressure, pulse, and oxygen                            saturations were monitored continuously. Scope In: Scope Out: Findings:      ENDOSCOPIC FINDING: :      The examined esophagus was endoscopically normal.      Small to medium sized hiatal hernia.      The previously noted duodenal subepithelial lesion in second duodenum,       just distal to the major papilla was again noted. It was bulbous and       soft, measuring approximately 1.5cm endoscopically.      EUS findings:      1. The duodenal lesion above correlated with a mixed microcystic, solid       lesion along the wall of the duodenum that involved the deep mucosa and       submucosa layers. The muscularis propria layer is not involved. The       lesion measures 1.7cm across.      2. CBD was normal, non-dilated       3. Limited views of the pancreas, liver, spleen, portal and splenic       vessels were all normal. Impression:               - Essentially unchanged subepithelial lesion of the                            second duodenum. This has no worrisome features by                            EUS. Previous FNA suggested Brunner's gland                            hyperplasia and I suspect that is what this                            represents. It is not causing any symptoms and I                            don't think there is any need for further                            surveillance.                           - This morning he complained of significant                            heartburn. He's had no dysphagia. He is not on PPI  and I called him in omeprazole 40mg  pills to take                            one pill once daily before breakfast. Moderate Sedation:      Not Applicable - Patient had care per Anesthesia. Recommendation:           - Discharge patient to home.                           - Follow up with GI as needed. Procedure Code(s):        --- Professional ---                           [phone], Esophagogastroduodenoscopy, flexible,                            transoral; with endoscopic ultrasound examination,                            including the esophagus, stomach, and either the                            duodenum or a surgically altered stomach where the                            jejunum is examined distal to the anastomosis Diagnosis Code(s):        --- Professional ---                           K31.89, Other diseases of stomach and duodenum                           R12, Heartburn                           R93.3, Abnormal findings on diagnostic imaging of                            other parts of digestive tract CPT copyright 2019 [employer]. All rights reserved. The codes documented in this report are preliminary and upon coder review  may  be revised to meet current compliance requirements. Milus Banister, MD 06/27/2021 9:55:40 AM This report has been signed electronically. Number of Addenda: 0

## 2021-06-27 NOTE — Discharge Instructions (Signed)
YOU HAD AN ENDOSCOPIC PROCEDURE TODAY: Refer to the procedure report and other information in the discharge instructions given to you for any specific questions about what was found during the examination. If this information does not answer your questions, please call [place] office at [phone] to clarify.   YOU SHOULD EXPECT: Some feelings of bloating in the abdomen. Passage of more gas than usual. Walking can help get rid of the air that was put into your GI tract during the procedure and reduce the bloating. If you had a lower endoscopy (such as a colonoscopy or flexible sigmoidoscopy) you may notice spotting of blood in your stool or on the toilet paper. Some abdominal soreness may be present for a day or two, also.  DIET: Your first meal following the procedure should be a light meal and then it is ok to progress to your normal diet. A half-sandwich or bowl of soup is an example of a good first meal. Heavy or fried foods are harder to digest and may make you feel nauseous or bloated. Drink plenty of fluids but you should avoid alcoholic beverages for 24 hours. If you had a esophageal dilation, please see attached instructions for diet.    ACTIVITY: Your care partner should take you home directly after the procedure. You should plan to take it easy, moving slowly for the rest of the day. You can resume normal activity the day after the procedure however YOU SHOULD NOT DRIVE, use power tools, machinery or perform tasks that involve climbing or major physical exertion for 24 hours (because of the sedation medicines used during the test).   SYMPTOMS TO REPORT IMMEDIATELY: A gastroenterologist can be reached at any hour. Please call [phone]  for any of the following symptoms:   Following upper endoscopy (EGD, EUS, ERCP, esophageal dilation) Vomiting of blood or coffee ground material  New, significant abdominal pain  New, significant chest pain or pain under the shoulder blades  Painful or  persistently difficult swallowing  New shortness of breath  Black, tarry-looking or red, bloody stools  FOLLOW UP:  If any biopsies were taken you will be contacted by phone or by letter within the next 1-3 weeks. Call [phone]  if you have not heard about the biopsies in 3 weeks.  Please also call with any specific questions about appointments or follow up tests.  

## 2021-06-27 NOTE — Transfer of Care (Signed)
Immediate Anesthesia Transfer of Care Note  Patient: Brian Allen.  Procedure(s) Performed: UPPER ENDOSCOPIC ULTRASOUND (EUS) RADIAL ESOPHAGOGASTRODUODENOSCOPY (EGD)  Patient Location: PACU  Anesthesia Type:MAC  Level of Consciousness: awake  Airway & Oxygen Therapy: Patient Spontanous Breathing and Patient connected to face mask oxygen  Post-op Assessment: Report given to RN and Post -op Vital signs reviewed and stable  Post vital signs: Reviewed and stable  Last Vitals:  Vitals Value Taken Time  BP 129/75 06/27/21 0944  Temp    Pulse 64 06/27/21 0945  Resp 20 06/27/21 0945  SpO2 100 % 06/27/21 0945  Vitals shown include unvalidated device data.  Last Pain:  Vitals:   06/27/21 0850  TempSrc: Oral  PainSc: 0-No pain         Complications: No notable events documented.
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
## 2021-07-16 ENCOUNTER — Other Ambulatory Visit: Payer: Self-pay | Admitting: Family Medicine

## 2021-07-16 ENCOUNTER — Ambulatory Visit
Admission: RE | Admit: 2021-07-16 | Discharge: 2021-07-16 | Disposition: A | Discharge status: Home / Self Care | Payer: Medicare HMO | Source: Ambulatory Visit | Attending: Family Medicine | Admitting: Family Medicine

## 2021-07-16 DIAGNOSIS — M5440 Lumbago with sciatica, unspecified side: Secondary | ICD-10-CM

## 2021-09-17 ENCOUNTER — Other Ambulatory Visit: Payer: Self-pay | Admitting: Student

## 2021-09-17 ENCOUNTER — Ambulatory Visit
Admission: RE | Admit: 2021-09-17 | Discharge: 2021-09-17 | Disposition: A | Discharge status: Home / Self Care | Payer: Medicare HMO | Source: Ambulatory Visit | Attending: Student | Admitting: Student

## 2021-09-17 DIAGNOSIS — M5090 Cervical disc disorder, unspecified, unspecified cervical region: Secondary | ICD-10-CM

## 2021-09-19 ENCOUNTER — Other Ambulatory Visit: Payer: Self-pay | Admitting: Family Medicine

## 2021-09-19 DIAGNOSIS — F17211 Nicotine dependence, cigarettes, in remission: Secondary | ICD-10-CM

## 2021-09-27 ENCOUNTER — Encounter: Payer: Self-pay | Admitting: *Deleted

## 2021-09-27 ENCOUNTER — Ambulatory Visit (INDEPENDENT_AMBULATORY_CARE_PROVIDER_SITE_OTHER): Payer: Medicare HMO | Admitting: *Deleted

## 2021-09-27 DIAGNOSIS — Z0000 Encounter for general adult medical examination without abnormal findings: Secondary | ICD-10-CM | POA: Diagnosis not present

## 2021-09-27 NOTE — Patient Instructions (Signed)
Health Maintenance, Male Adopting a healthy lifestyle and getting preventive care are important in promoting health and wellness. Ask your health care provider about: The right schedule for you to have regular tests and exams. Things you can do on your own to prevent diseases and keep yourself healthy. What should I know about diet, weight, and exercise? Eat a healthy diet  Eat a diet that includes plenty of vegetables, fruits, low-fat dairy products, and lean protein. Do not eat a lot of foods that are high in solid fats, added sugars, or sodium. Maintain a healthy weight Body mass index (BMI) is a measurement that can be used to identify possible weight problems. It estimates body fat based on height and weight. Your health care provider can help determine your BMI and help you achieve or maintain a healthy weight. Get regular exercise Get regular exercise. This is one of the most important things you can do for your health. Most adults should: Exercise for at least 150 minutes each week. The exercise should increase your heart rate and make you sweat (moderate-intensity exercise). Do strengthening exercises at least twice a week. This is in addition to the moderate-intensity exercise. Spend less time sitting. Even light physical activity can be beneficial. Watch cholesterol and blood lipids Have your blood tested for lipids and cholesterol at 77 years of age, then have this test every 5 years. You may need to have your cholesterol levels checked more often if: Your lipid or cholesterol levels are high. You are older than 77 years of age. You are at high risk for heart disease. What should I know about cancer screening? Many types of cancers can be detected early and may often be prevented. Depending on your health history and family history, you may need to have cancer screening at various ages. This may include screening for: Colorectal cancer. Prostate cancer. Skin cancer. Lung  cancer. What should I know about heart disease, diabetes, and high blood pressure? Blood pressure and heart disease High blood pressure causes heart disease and increases the risk of stroke. This is more likely to develop in people who have high blood pressure readings, are of African descent, or are overweight. Talk with your health care provider about your target blood pressure readings. Have your blood pressure checked: Every 3-5 years if you are 18-39 years of age. Every year if you are 40 years old or older. If you are between the ages of 65 and 75 and are a current or former smoker, ask your health care provider if you should have a one-time screening for abdominal aortic aneurysm (AAA). Diabetes Have regular diabetes screenings. This checks your fasting blood sugar level. Have the screening done: Once every three years after age 45 if you are at a normal weight and have a low risk for diabetes. More often and at a younger age if you are overweight or have a high risk for diabetes. What should I know about preventing infection? Hepatitis B If you have a higher risk for hepatitis B, you should be screened for this virus. Talk with your health care provider to find out if you are at risk for hepatitis B infection. Hepatitis C Blood testing is recommended for: Everyone born from 1945 through 1965. Anyone with known risk factors for hepatitis C. Sexually transmitted infections (STIs) You should be screened each year for STIs, including gonorrhea and chlamydia, if: You are sexually active and are younger than 77 years of age. You are older than 77 years   of age and your health care provider tells you that you are at risk for this type of infection. Your sexual activity has changed since you were last screened, and you are at increased risk for chlamydia or gonorrhea. Ask your health care provider if you are at risk. Ask your health care provider about whether you are at high risk for HIV.  Your health care provider may recommend a prescription medicine to help prevent HIV infection. If you choose to take medicine to prevent HIV, you should first get tested for HIV. You should then be tested every 3 months for as long as you are taking the medicine. Follow these instructions at home: Lifestyle Do not use any products that contain nicotine or tobacco, such as cigarettes, e-cigarettes, and chewing tobacco. If you need help quitting, ask your health care provider. Do not use street drugs. Do not share needles. Ask your health care provider for help if you need support or information about quitting drugs. Alcohol use Do not drink alcohol if your health care provider tells you not to drink. If you drink alcohol: Limit how much you have to 0-2 drinks a day. Be aware of how much alcohol is in your drink. In the [place] one drink equals one 12 oz bottle of beer (355 mL), one 5 oz glass of wine (148 mL), or one 1 oz glass of hard liquor (44 mL). General instructions Schedule regular health, dental, and eye exams. Stay current with your vaccines. Tell your health care provider if: You often feel depressed. You have ever been abused or do not feel safe at home. Summary Adopting a healthy lifestyle and getting preventive care are important in promoting health and wellness. Follow your health care provider's instructions about healthy diet, exercising, and getting tested or screened for diseases. Follow your health care provider's instructions on monitoring your cholesterol and blood pressure. This information is not intended to replace advice given to you by your health care provider. Make sure you discuss any questions you have with your health care provider. Document Revised: 02/22/2021 Document Reviewed: 12/08/2018 Elsevier Patient Education  2022 [employer]  
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
## 2022-04-03 ENCOUNTER — Encounter (HOSPITAL_COMMUNITY): Payer: Self-pay

## 2022-04-03 ENCOUNTER — Emergency Department (HOSPITAL_COMMUNITY)
Admission: EM | Admit: 2022-04-03 | Discharge: 2022-04-03 | Disposition: A | Discharge status: Home / Self Care | Payer: Medicare (Managed Care) | Attending: Emergency Medicine | Admitting: Emergency Medicine

## 2022-04-03 ENCOUNTER — Other Ambulatory Visit: Payer: Self-pay

## 2022-04-03 ENCOUNTER — Emergency Department (HOSPITAL_COMMUNITY): Payer: Medicare (Managed Care)

## 2022-04-03 DIAGNOSIS — I10 Essential (primary) hypertension: Secondary | ICD-10-CM | POA: Diagnosis not present

## 2022-04-03 DIAGNOSIS — Z20822 Contact with and (suspected) exposure to covid-19: Secondary | ICD-10-CM | POA: Insufficient documentation

## 2022-04-03 DIAGNOSIS — Z79899 Other long term (current) drug therapy: Secondary | ICD-10-CM | POA: Insufficient documentation

## 2022-04-03 DIAGNOSIS — J069 Acute upper respiratory infection, unspecified: Secondary | ICD-10-CM | POA: Diagnosis not present

## 2022-04-03 DIAGNOSIS — J40 Bronchitis, not specified as acute or chronic: Secondary | ICD-10-CM | POA: Diagnosis not present

## 2022-04-03 DIAGNOSIS — Z7982 Long term (current) use of aspirin: Secondary | ICD-10-CM | POA: Diagnosis not present

## 2022-04-03 DIAGNOSIS — M791 Myalgia, unspecified site: Secondary | ICD-10-CM | POA: Insufficient documentation

## 2022-04-03 DIAGNOSIS — J029 Acute pharyngitis, unspecified: Secondary | ICD-10-CM | POA: Diagnosis present

## 2022-04-03 LAB — CBC WITH DIFFERENTIAL/PLATELET
Abs Immature Granulocytes: 0.05 10*3/uL (ref 0.00–0.07)
Basophils Absolute: 0.1 10*3/uL (ref 0.0–0.1)
Basophils Relative: 1 %
Eosinophils Absolute: 0.5 10*3/uL (ref 0.0–0.5)
Eosinophils Relative: 6 %
HCT: 33.3 % — ABNORMAL LOW (ref 39.0–52.0)
Hemoglobin: 11 g/dL — ABNORMAL LOW (ref 13.0–17.0)
Immature Granulocytes: 1 %
Lymphocytes Relative: 20 %
Lymphs Abs: 1.5 10*3/uL (ref 0.7–4.0)
MCH: 30.6 pg (ref 26.0–34.0)
MCHC: 33 g/dL (ref 30.0–36.0)
MCV: 92.8 fL (ref 80.0–100.0)
Monocytes Absolute: 0.9 10*3/uL (ref 0.1–1.0)
Monocytes Relative: 12 %
Neutro Abs: 4.4 10*3/uL (ref 1.7–7.7)
Neutrophils Relative %: 60 %
Platelets: 288 10*3/uL (ref 150–400)
RBC: 3.59 MIL/uL — ABNORMAL LOW (ref 4.22–5.81)
RDW: 13.6 % (ref 11.5–15.5)
WBC: 7.4 10*3/uL (ref 4.0–10.5)
nRBC: 0 % (ref 0.0–0.2)

## 2022-04-03 LAB — BASIC METABOLIC PANEL
Anion gap: 5 (ref 5–15)
BUN: 9 mg/dL (ref 8–23)
CO2: 27 mmol/L (ref 22–32)
Calcium: 9.1 mg/dL (ref 8.9–10.3)
Chloride: 109 mmol/L (ref 98–111)
Creatinine, Ser: 0.86 mg/dL (ref 0.61–1.24)
GFR, Estimated: >60 mL/min (ref >60)
Glucose, Bld: 114 mg/dL — ABNORMAL HIGH (ref 70–99)
Potassium: 4 mmol/L (ref 3.5–5.1)
Sodium: 141 mmol/L (ref 135–145)

## 2022-04-03 LAB — RESP PANEL BY RT-PCR (FLU A&B, COVID) ARPGX2
Influenza A by PCR: NEGATIVE
Influenza B by PCR: NEGATIVE
SARS Coronavirus 2 by RT PCR: NEGATIVE

## 2022-04-03 MED ORDER — IPRATROPIUM-ALBUTEROL 0.5-2.5 (3) MG/3ML IN SOLN
3.0000 mL | Freq: Once | RESPIRATORY_TRACT | Status: AC
  Administered 2022-04-03: 3 mL via RESPIRATORY_TRACT
  Filled 2022-04-03: qty 3

## 2022-04-03 MED ORDER — ALBUTEROL SULFATE HFA 108 (90 BASE) MCG/ACT IN AERS: 1.0000 | INHALATION_SPRAY | Freq: Four times a day (QID) | RESPIRATORY_TRACT | 0 refills | Status: AC | PRN

## 2022-04-03 MED ORDER — PROMETHAZINE-DM 6.25-15 MG/5ML PO SYRP: 2.5000 mL | ORAL_SOLUTION | Freq: Four times a day (QID) | ORAL | 0 refills | Status: AC | PRN

## 2022-04-03 NOTE — ED Provider Notes (Signed)
?[facility] DEPT ?Provider Note ? ? ?CSN: [id number] ?Arrival date & time: 04/03/22  0609 ? ?  ? ?History ? ?Chief Complaint  ?Patient presents with  ? Sore Throat  ? Back Pain  ? Cough  ? ? ?Brian Allen. is a 76 y.o. male who presents the emergency department complaining of cough, sore throat, and generalized body aches for 5 days.  Patient states that he is having soreness in his chest as well as his back, especially after heavy coughing.  This soreness is not exertional.  He denies any shortness of breath or dyspnea on exertion.  Denies fever, but wife states that he will wake up in the middle the night with the sheets soaked from sweat.  He has been taking allergy medication without relief.  No history of lung disease, does not smoke. ? ?Sore Throat ?Pertinent negatives include no abdominal pain.  ?Back Pain ?Associated symptoms: fever   ?Associated symptoms: no abdominal pain   ?Cough ?Associated symptoms: chills, diaphoresis, fever, sore throat and wheezing   ? ?  ? ?Home Medications ?Prior to Admission medications   ?Medication Sig Start Date End Date Taking? Authorizing Provider  ?albuterol (VENTOLIN HFA) 108 (90 Base) MCG/ACT inhaler Inhale 1-2 puffs into the lungs every 6 (six) hours as needed for wheezing or shortness of breath. 04/03/22  Yes Cornie Mccomber T, PA-C  ?promethazine-dextromethorphan (PROMETHAZINE-DM) 6.25-15 MG/5ML syrup Take 2.5 mLs by mouth 4 (four) times daily as needed for cough. 04/03/22  Yes Kalise Fickett T, PA-C  ?acetaminophen (TYLENOL) 500 MG tablet Take 1 tablet (500 mg total) by mouth every 6 (six) hours as needed. ?Patient taking differently: Take 500 mg by mouth every 6 (six) hours as needed for moderate pain. 01/09/20   LampteyMyrene Galas, MD  ?amLODipine (NORVASC) 10 MG tablet Take 1 tablet (10 mg total) by mouth daily. 04/30/21   Biagio Borg, MD  ?aspirin 81 MG tablet Take 81 mg by mouth daily.    [provider]  ?atorvastatin  (LIPITOR) 80 MG tablet SMARTSIG:1 Tablet(s) By Mouth Every Evening 04/30/21   Biagio Borg, MD  ?carvedilol (COREG) 3.125 MG tablet Take 1 tablet (3.125 mg total) by mouth 2 (two) times daily with a meal. 04/30/21   Biagio Borg, MD  ?dorzolamide-timolol (COSOPT) 22.3-6.8 MG/ML ophthalmic solution Place 1 drop into both eyes 2 (two) times daily. 03/27/20   [provider]  ?fesoterodine (TOVIAZ) 4 MG TB24 tablet Take 1 tablet (4 mg total) by mouth daily. 10/12/20   Biagio Borg, MD  ?gabapentin (NEURONTIN) 100 MG capsule Take 1 capsule (100 mg total) by mouth 3 (three) times daily. 07/12/20   Biagio Borg, MD  ?latanoprost (XALATAN) 0.005 % ophthalmic solution Place 1 drop into both eyes at bedtime.    [provider]  ?linaclotide Rolan Lipa) 290 MCG CAPS capsule Take 1 capsule (290 mcg total) by mouth daily before breakfast. 04/13/20   Milus Banister, MD  ?lisinopril (ZESTRIL) 40 MG tablet Take 1 tablet (40 mg total) by mouth daily. 04/30/21   Biagio Borg, MD  ?omeprazole (PRILOSEC) 40 MG capsule Take 1 capsule (40 mg total) by mouth daily before breakfast. 06/27/21   Milus Banister, MD  ?sildenafil (VIAGRA) 100 MG tablet Take 0.5-1 tablets (50-100 mg total) by mouth daily as needed for erectile dysfunction. 09/22/18   Biagio Borg, MD  ?sucralfate (CARAFATE) 1 GM/10ML suspension Take 10 mLs (1 g total) by mouth at bedtime.  02/23/20   Armbruster, Carlota Raspberry, MD  ?   ? ?Allergies    ?Sildenafil citrate   ? ?Review of Systems   ?Review of Systems  ?Constitutional:  Positive for chills, diaphoresis and fever.  ?HENT:  Positive for congestion and sore throat. Negative for trouble swallowing.   ?Respiratory:  Positive for cough, chest tightness and wheezing.   ?Gastrointestinal:  Negative for abdominal pain, diarrhea, nausea and vomiting.  ?Musculoskeletal:  Positive for back pain.  ?All other systems reviewed and are negative. ? ?Physical Exam ?Updated Vital Signs ?BP 125/76   Pulse 64   Temp 98.9 ?F  (37.2 ?C) (Oral)   Resp 19   Ht '5\' 5"'$  (1.651 m)   Wt 73.5 kg   SpO2 92%   BMI 26.96 kg/m?  ?Physical Exam ?Vitals and nursing note reviewed.  ?Constitutional:   ?   Appearance: Normal appearance.  ?HENT:  ?   Head: Normocephalic and atraumatic.  ?   Nose: Congestion present.  ?   Mouth/Throat:  ?   Lips: Pink.  ?   Mouth: Mucous membranes are moist.  ?   Pharynx: Oropharynx is clear. Uvula midline. No oropharyngeal exudate or posterior oropharyngeal erythema.  ?   Tonsils: No tonsillar exudate or tonsillar abscesses.  ?Eyes:  ?   Conjunctiva/sclera: Conjunctivae normal.  ?Cardiovascular:  ?   Rate and Rhythm: Normal rate and regular rhythm.  ?Pulmonary:  ?   Effort: Pulmonary effort is normal. No respiratory distress.  ?   Breath sounds: Wheezing present.  ?   Comments: Diffuse expiratory wheezing ?Abdominal:  ?   General: There is no distension.  ?   Palpations: Abdomen is soft.  ?   Tenderness: There is no abdominal tenderness.  ?Skin: ?   General: Skin is warm and dry.  ?Neurological:  ?   General: No focal deficit present.  ?   Mental Status: He is alert.  ? ? ?ED Results / Procedures / Treatments   ?Labs ?(all labs ordered are listed, but only abnormal results are displayed) ?Labs Reviewed  ?CBC WITH DIFFERENTIAL/PLATELET - Abnormal; Notable for the following components:  ?    Result Value  ? RBC 3.59 (*)   ? Hemoglobin 11.0 (*)   ? HCT 33.3 (*)   ? All other components within normal limits  ?BASIC METABOLIC PANEL - Abnormal; Notable for the following components:  ? Glucose, Bld 114 (*)   ? All other components within normal limits  ?RESP PANEL BY RT-PCR (FLU A&B, COVID) ARPGX2  ? ? ?EKG ?EKG Interpretation ? ?Date/Time:  Thursday April 03 2022 07:50:36 EDT ?Ventricular Rate:  63 ?PR Interval:  210 ?QRS Duration: 97 ?QT Interval:  423 ?QTC Calculation: 433 ?R Axis:   8 ?Text Interpretation: Sinus rhythm Abnormal R-wave progression, early transition No significant change since last tracing Confirmed by  Deno Etienne [phone]) on 04/03/2022 9:07:27 AM ? ?Radiology ?DG Chest 2 View ? ?Result Date: 04/03/2022 ?CLINICAL DATA:  productive cough x 1 week EXAM: CHEST - 2 VIEW COMPARISON:  Radiograph 05/13/2021 FINDINGS: Unchanged cardiomediastinal silhouette. There is no focal airspace consolidation. There is unchanged bibasilar scarring. Unchanged metallic fragment overlying the upper mid chest. There is no pleural effusion. No pneumothorax. There is no acute osseous abnormality. IMPRESSION: No evidence of acute cardiopulmonary disease. Electronically Signed   By: Maurine Simmering M.D.   On: 04/03/2022 07:34   ? ?Procedures ?Procedures  ? ? ?Medications Ordered in ED ?Medications  ?ipratropium-albuterol (DUONEB) 0.5-2.5 (3) MG/3ML  nebulizer solution 3 mL (3 mLs Nebulization Given 04/03/22 0733)  ? ? ?ED Course/ Medical Decision Making/ A&P ?  ?                        ?Medical Decision Making ? ?This patient presents to the ED for concern of cough and chest soreness, this involves an extensive number of treatment options, and is a complaint that carries with it a high risk of complications and morbidity. The emergent differential diagnosis prior to evaluation includes, but is not limited to,  Upper respiratory infection, lower respiratory infection, allergies, asthma, irritants, foreign body, medications (ACE inhibitors), reflux, asthma, CHF, lung cancer, interstitial lung disease, psychiatric causes, postnasal drip and postinfectious bronchospasm.  ? ?This is not an exhaustive differential.  ? ?Past Medical History / Co-morbidities / Social History: ?HTN, HLD, GERD ? ?Physical Exam: ?Physical exam performed. The pertinent findings include: Patient is afebrile, not tachycardic, not hypoxic, no acute distress.  Diffuse expiratory wheezing in all lung fields.  On my reevaluation, patient's oxygen saturation was at 89% on room air, placed on 2 L nasal cannula. ? ?Patient ambulated around the department and maintained oxygen saturation of  92%.  ? ?Lab Tests: ?I ordered, and personally interpreted labs.  The pertinent results include: No leukocytosis, electrolytes within normal limits.  Negative for COVID and influenza. ?  ?Imaging Studies: ?I ordered imag

## 2022-04-03 NOTE — ED Notes (Signed)
?   04/03/22 0900  ?Resting  ?Supplemental oxygen during test? No  ?Resting Heart Rate 84  ?Resting Sp02 96  ?Lap 1 (250 feet)  ?HR 85  ?02 Sat 93  ?Lap 2 (250 feet)  ?HR 84  ?02 Sat 92  ?Lap 3 (250 feet)  ?HR 84  ?02 Sat 92  ? ? ?

## 2022-04-03 NOTE — ED Triage Notes (Signed)
Pt complains of sore throat, back pain, and cough x 5 days.  ?

## 2022-04-03 NOTE — Discharge Instructions (Addendum)
You are seen in the emergency department today for cough and sore throat. ? ?As we discussed your lab work and imaging has all looked reassuring today.  I suspect that your symptoms are likely related to a viral bronchitis.  We have tested you for COVID and flu, and these results were negative. ? ?We gave you one breathing treatment while in the department, and I am discharging you with an inhaler to use at home. You can use this every 6 hours as needed for wheezing or shortness of breath/chest tightness. I am also prescribing cough syrup which should help suppress your cough and make you more comfortable.   ? ?I recommend following up with your primary doctor about your oxygen levels. They dropped down while you were sleeping, and this could be indicative of sleep apnea.  ? ?Continue to monitor how you're doing and return to the ER for new or worsening symptoms.  ?

## 2022-04-03 NOTE — ED Notes (Signed)
Patient transported to X-ray 

## 2022-05-16 ENCOUNTER — Other Ambulatory Visit: Payer: Self-pay

## 2022-05-16 ENCOUNTER — Emergency Department (HOSPITAL_COMMUNITY)
Admission: EM | Admit: 2022-05-16 | Discharge: 2022-05-17 | Disposition: A | Discharge status: Home / Self Care | Payer: Medicare (Managed Care) | Attending: Emergency Medicine | Admitting: Emergency Medicine

## 2022-05-16 ENCOUNTER — Encounter (HOSPITAL_COMMUNITY): Payer: Self-pay

## 2022-05-16 ENCOUNTER — Emergency Department (HOSPITAL_COMMUNITY): Payer: Medicare (Managed Care)

## 2022-05-16 DIAGNOSIS — M25562 Pain in left knee: Secondary | ICD-10-CM | POA: Insufficient documentation

## 2022-05-16 DIAGNOSIS — Z7982 Long term (current) use of aspirin: Secondary | ICD-10-CM | POA: Diagnosis not present

## 2022-05-16 DIAGNOSIS — Z8546 Personal history of malignant neoplasm of prostate: Secondary | ICD-10-CM | POA: Diagnosis not present

## 2022-05-16 DIAGNOSIS — M79672 Pain in left foot: Secondary | ICD-10-CM | POA: Insufficient documentation

## 2022-05-16 MED ORDER — OXYCODONE-ACETAMINOPHEN 5-325 MG PO TABS
1.0000 | ORAL_TABLET | Freq: Once | ORAL | Status: AC
  Administered 2022-05-16: 1 via ORAL
  Filled 2022-05-16: qty 1

## 2022-05-16 MED ORDER — DICLOFENAC SODIUM 1 % EX GEL: 2.0000 g | Freq: Three times a day (TID) | CUTANEOUS | 0 refills | Status: AC | PRN

## 2022-05-16 NOTE — ED Triage Notes (Signed)
Patient reports that he has had left knee swelling x 2 months. Patient c/o left lower leg , ankle, and foot swelling since last night.

## 2022-05-16 NOTE — ED Provider Notes (Signed)
[facility] DEPT Provider Note   CSN: [id number] Arrival date & time: 05/16/22  1600     History {Add pertinent medical, surgical, social history, OB history to HPI:1} Chief Complaint  Patient presents with   Knee Pain   Leg Swelling    Brian Allen. is a 76 y.o. male.  HPI     Home Medications Prior to Admission medications   Medication Sig Start Date End Date Taking? Authorizing Provider  acetaminophen (TYLENOL) 500 MG tablet Take 1 tablet (500 mg total) by mouth every 6 (six) hours as needed. Patient taking differently: Take 500 mg by mouth every 6 (six) hours as needed for moderate pain. 01/09/20   Lamptey, Myrene Galas, MD  albuterol (VENTOLIN HFA) 108 (90 Base) MCG/ACT inhaler Inhale 1-2 puffs into the lungs every 6 (six) hours as needed for wheezing or shortness of breath. 04/03/22   Roemhildt, Lorin T, PA-C  amLODipine (NORVASC) 10 MG tablet Take 1 tablet (10 mg total) by mouth daily. 04/30/21   Biagio Borg, MD  aspirin 81 MG tablet Take 81 mg by mouth daily.    [provider]  atorvastatin (LIPITOR) 80 MG tablet SMARTSIG:1 Tablet(s) By Mouth Every Evening 04/30/21   Biagio Borg, MD  carvedilol (COREG) 3.125 MG tablet Take 1 tablet (3.125 mg total) by mouth 2 (two) times daily with a meal. 04/30/21   Biagio Borg, MD  dorzolamide-timolol (COSOPT) 22.3-6.8 MG/ML ophthalmic solution Place 1 drop into both eyes 2 (two) times daily. 03/27/20   [provider]  fesoterodine (TOVIAZ) 4 MG TB24 tablet Take 1 tablet (4 mg total) by mouth daily. 10/12/20   Biagio Borg, MD  gabapentin (NEURONTIN) 100 MG capsule Take 1 capsule (100 mg total) by mouth 3 (three) times daily. 07/12/20   Biagio Borg, MD  latanoprost (XALATAN) 0.005 % ophthalmic solution Place 1 drop into both eyes at bedtime.    [provider]  linaclotide Rolan Lipa) 290 MCG CAPS capsule Take 1 capsule (290 mcg total) by mouth daily before breakfast. 04/13/20    Milus Banister, MD  lisinopril (ZESTRIL) 40 MG tablet Take 1 tablet (40 mg total) by mouth daily. 04/30/21   Biagio Borg, MD  omeprazole (PRILOSEC) 40 MG capsule Take 1 capsule (40 mg total) by mouth daily before breakfast. 06/27/21   Milus Banister, MD  promethazine-dextromethorphan (PROMETHAZINE-DM) 6.25-15 MG/5ML syrup Take 2.5 mLs by mouth 4 (four) times daily as needed for cough. 04/03/22   Roemhildt, Lorin T, PA-C  sildenafil (VIAGRA) 100 MG tablet Take 0.5-1 tablets (50-100 mg total) by mouth daily as needed for erectile dysfunction. 09/22/18   Biagio Borg, MD  sucralfate (CARAFATE) 1 GM/10ML suspension Take 10 mLs (1 g total) by mouth at bedtime. 02/23/20   Armbruster, Carlota Raspberry, MD      Allergies    Sildenafil citrate    Review of Systems   Review of Systems  Physical Exam Updated Vital Signs BP (!) 150/81   Pulse 60   Temp 99.2 F (37.3 C) (Oral)   Resp 16   Ht '5\' 5"'$  (1.651 m)   Wt 73.5 kg   SpO2 94%   BMI 26.96 kg/m  Physical Exam  ED Results / Procedures / Treatments   Labs (all labs ordered are listed, but only abnormal results are displayed) Labs Reviewed - No data to display  EKG None  Radiology DG Knee Complete 4 Views Left  Result Date: 05/16/2022 CLINICAL  DATA:  Pain EXAM: LEFT KNEE - COMPLETE 4+ VIEW COMPARISON:  None Available. FINDINGS: Chondrocalcinosis, particularly in the lateral compartment. No fracture or dislocation. No significant joint effusion. Mild degenerative changes in the patellofemoral compartment. IMPRESSION: 1. Chondrocalcinosis consistent with CPPD. 2. Mild degenerative changes in the patellofemoral compartment. Electronically Signed   By: Dorise Bullion III M.D.   On: 05/16/2022 17:32   DG Foot Complete Left  Result Date: 05/16/2022 CLINICAL DATA:  Pain EXAM: LEFT FOOT - COMPLETE 3+ VIEW COMPARISON:  None Available. FINDINGS: There is no evidence of fracture or dislocation. There is no evidence of arthropathy or other focal bone  abnormality. Soft tissues are unremarkable. IMPRESSION: Negative. Electronically Signed   By: Dorise Bullion III M.D.   On: 05/16/2022 17:34    Procedures Procedures  {Document cardiac monitor, telemetry assessment procedure when appropriate:1}  Medications Ordered in ED Medications - No data to display  ED Course/ Medical Decision Making/ A&P                           Medical Decision Making  ***  {Document critical care time when appropriate:1} {Document review of labs and clinical decision tools ie heart score, Chads2Vasc2 etc:1}  {Document your independent review of radiology images, and any outside records:1} {Document your discussion with family members, caretakers, and with consultants:1} {Document social determinants of health affecting pt's care:1} {Document your decision making why or why not admission, treatments were needed:1} Final Clinical Impression(s) / ED Diagnoses Final diagnoses:  None    Rx / DC Orders ED Discharge Orders     None

## 2022-05-16 NOTE — ED Provider Triage Note (Signed)
Emergency Medicine Provider Triage Evaluation Note  Brian Allen. , a 76 y.o. male  was evaluated in triage.  Pt complains of left knee pain as well as left foot pain.  Patient reports he had left knee pain for the last 25 years, ligamentous repair on this knee and was advised by orthopedic doctor that he had arthritis.  Patient denies any trauma, preceding event to account for pain in knee or pain in foot.  Patient also states that he has pain in foot for the last couple of weeks, states that the pain begins in the back of his heel that extends underneath to the front.  Patient states the pain is worse upon waking in the morning, better after he walks on it.  Review of Systems  Positive:  Negative:   Physical Exam  BP (!) 141/95 (BP Location: Right Arm)   Pulse 80   Temp 99.2 F (37.3 C) (Oral)   Resp 18   Ht '5\' 5"'$  (1.651 m)   Wt 73.5 kg   SpO2 96%   BMI 26.96 kg/m  Gen:   Awake, no distress   Resp:  Normal effort  MSK:   Moves extremities without difficulty  Other:  Patient DP pulse detected with Doppler in triage.  Medical Decision Making  Medically screening exam initiated at 4:51 PM.  Appropriate orders placed.  Brian Allen. was informed that the remainder of the evaluation will be completed by another provider, this initial triage assessment does not replace that evaluation, and the importance of remaining in the ED until their evaluation is complete.     Azucena Cecil, PA-C 05/16/22 1653

## 2022-05-17 ENCOUNTER — Ambulatory Visit (HOSPITAL_COMMUNITY): Admission: RE | Admit: 2022-05-17 | Payer: Medicare (Managed Care) | Source: Ambulatory Visit

## 2022-05-17 NOTE — Discharge Instructions (Signed)
You are seen here today for your left knee pain and calf pain.  Regarding the left knee pain does like to read to your arthritis.  You have been prescribed a gel in place in the knee to help with your pain.  Regarding your calf pain, you have been ordered an ultrasound of your leg to rule out any sort of blood clot in your leg.  Please see the attached paperwork for further instructions regarding your ultrasound.  Follow-up with your primary care doctor and return to the ER with any new severe symptoms.  You may use Tylenol at home for your knee pain as well.

## 2022-07-21 ENCOUNTER — Encounter: Payer: Self-pay | Admitting: Gastroenterology

## 2022-07-30 ENCOUNTER — Emergency Department (HOSPITAL_COMMUNITY)
Admission: EM | Admit: 2022-07-30 | Discharge: 2022-07-30 | Disposition: A | Discharge status: Home / Self Care | Payer: Medicare (Managed Care) | Attending: Emergency Medicine | Admitting: Emergency Medicine

## 2022-07-30 ENCOUNTER — Encounter (HOSPITAL_COMMUNITY): Payer: Self-pay

## 2022-07-30 DIAGNOSIS — Y636 Underdosing and nonadministration of necessary drug, medicament or biological substance: Secondary | ICD-10-CM | POA: Diagnosis not present

## 2022-07-30 DIAGNOSIS — T426X4A Poisoning by other antiepileptic and sedative-hypnotic drugs, undetermined, initial encounter: Secondary | ICD-10-CM | POA: Insufficient documentation

## 2022-07-30 DIAGNOSIS — R42 Dizziness and giddiness: Secondary | ICD-10-CM | POA: Diagnosis present

## 2022-07-30 DIAGNOSIS — Z7982 Long term (current) use of aspirin: Secondary | ICD-10-CM | POA: Insufficient documentation

## 2022-07-30 DIAGNOSIS — B029 Zoster without complications: Secondary | ICD-10-CM | POA: Insufficient documentation

## 2022-07-30 DIAGNOSIS — T887XXA Unspecified adverse effect of drug or medicament, initial encounter: Secondary | ICD-10-CM

## 2022-07-30 MED ORDER — HYDROCODONE-ACETAMINOPHEN 5-325 MG PO TABS: 1.0000 | ORAL_TABLET | Freq: Four times a day (QID) | ORAL | 0 refills | Status: AC | PRN

## 2022-07-30 MED ORDER — MECLIZINE HCL 25 MG PO TABS
25.0000 mg | ORAL_TABLET | Freq: Once | ORAL | Status: AC
  Administered 2022-07-30: 25 mg via ORAL
  Filled 2022-07-30: qty 1

## 2022-07-30 NOTE — ED Triage Notes (Signed)
PT states he was diagnosed and treated for shingles. Pt has rash and pain to forehead and left eye. Pt states after starting treatment for the shingles, he has been experiencing some dizziness.

## 2022-07-30 NOTE — ED Provider Notes (Signed)
[facility] DEPT Provider Note   CSN: [id number] Arrival date & time: 07/30/22  1311     History  Chief Complaint  Patient presents with   Herpes Zoster   Dizziness    Brian Allen. is a 76 y.o. male here presenting with dizziness and shingles. Patient started having a rash to the left face about 6 days ago. Went to PCP and then saw eye doctor yesterday and was diagnosed with shingles with no eye involvement. Was prescribed gabapentin and valtrex and took several doses and had some dizziness. Denies passing out or falling.   The history is provided by the patient.       Home Medications Prior to Admission medications   Medication Sig Start Date End Date Taking? Authorizing Provider  acetaminophen (TYLENOL) 500 MG tablet Take 1 tablet (500 mg total) by mouth every 6 (six) hours as needed. Patient taking differently: Take 500 mg by mouth every 6 (six) hours as needed for moderate pain. 01/09/20   Lamptey, Myrene Galas, MD  albuterol (VENTOLIN HFA) 108 (90 Base) MCG/ACT inhaler Inhale 1-2 puffs into the lungs every 6 (six) hours as needed for wheezing or shortness of breath. 04/03/22   Roemhildt, Lorin T, PA-C  amLODipine (NORVASC) 10 MG tablet Take 1 tablet (10 mg total) by mouth daily. 04/30/21   Biagio Borg, MD  aspirin 81 MG tablet Take 81 mg by mouth daily.    [provider]  atorvastatin (LIPITOR) 80 MG tablet SMARTSIG:1 Tablet(s) By Mouth Every Evening 04/30/21   Biagio Borg, MD  carvedilol (COREG) 3.125 MG tablet Take 1 tablet (3.125 mg total) by mouth 2 (two) times daily with a meal. 04/30/21   Biagio Borg, MD  diclofenac Sodium (VOLTAREN) 1 % GEL Apply 2 g topically 3 (three) times daily as needed. 05/16/22   Sponseller, Eugene Garnet R, PA-C  dorzolamide-timolol (COSOPT) 22.3-6.8 MG/ML ophthalmic solution Place 1 drop into both eyes 2 (two) times daily. 03/27/20   [provider]  fesoterodine (TOVIAZ) 4 MG TB24 tablet Take 1 tablet (4  mg total) by mouth daily. 10/12/20   Biagio Borg, MD  gabapentin (NEURONTIN) 100 MG capsule Take 1 capsule (100 mg total) by mouth 3 (three) times daily. 07/12/20   Biagio Borg, MD  latanoprost (XALATAN) 0.005 % ophthalmic solution Place 1 drop into both eyes at bedtime.    [provider]  linaclotide Rolan Lipa) 290 MCG CAPS capsule Take 1 capsule (290 mcg total) by mouth daily before breakfast. 04/13/20   Milus Banister, MD  lisinopril (ZESTRIL) 40 MG tablet Take 1 tablet (40 mg total) by mouth daily. 04/30/21   Biagio Borg, MD  omeprazole (PRILOSEC) 40 MG capsule Take 1 capsule (40 mg total) by mouth daily before breakfast. 06/27/21   Milus Banister, MD  promethazine-dextromethorphan (PROMETHAZINE-DM) 6.25-15 MG/5ML syrup Take 2.5 mLs by mouth 4 (four) times daily as needed for cough. 04/03/22   Roemhildt, Lorin T, PA-C  sildenafil (VIAGRA) 100 MG tablet Take 0.5-1 tablets (50-100 mg total) by mouth daily as needed for erectile dysfunction. 09/22/18   Biagio Borg, MD  sucralfate (CARAFATE) 1 GM/10ML suspension Take 10 mLs (1 g total) by mouth at bedtime. 02/23/20   Armbruster, Carlota Raspberry, MD      Allergies    Sildenafil citrate    Review of Systems   Review of Systems  Neurological:  Positive for dizziness.  All other systems reviewed and are negative.  Physical Exam Updated Vital Signs BP 130/61   Pulse 66   Temp 99.4 F (37.4 C) (Oral)   Resp 16   Ht '5\' 5"'$  (1.651 m)   Wt 72.6 kg   SpO2 96%   BMI 26.63 kg/m  Physical Exam Vitals and nursing note reviewed.  HENT:     Head: Normocephalic.     Right Ear: Tympanic membrane normal.     Left Ear: Tympanic membrane normal.     Nose: Nose normal.     Mouth/Throat:     Mouth: Mucous membranes are moist.  Eyes:     Extraocular Movements: Extraocular movements intact.     Pupils: Pupils are equal, round, and reactive to light.  Cardiovascular:     Rate and Rhythm: Normal rate and regular rhythm.     Pulses: Normal  pulses.     Heart sounds: Normal heart sounds.  Pulmonary:     Effort: Pulmonary effort is normal.     Breath sounds: Normal breath sounds.  Abdominal:     General: Abdomen is flat.     Palpations: Abdomen is soft.  Musculoskeletal:        General: Normal range of motion.     Cervical back: Normal range of motion and neck supple.  Skin:    General: Skin is warm.     Capillary Refill: Capillary refill takes less than 2 seconds.     Comments: Vesicular rash in the L  V1  distribution   Neurological:     General: No focal deficit present.     Mental Status: He is alert and oriented to person, place, and time.     Comments: CN 2- 12 intact, no nystagmus, nl finger to nose bilaterally, steady gait   Psychiatric:        Mood and Affect: Mood normal.        Behavior: Behavior normal.     ED Results / Procedures / Treatments   Labs (all labs ordered are listed, but only abnormal results are displayed) Labs Reviewed - No data to display  EKG None  Radiology No results found.  Procedures Procedures    Medications Ordered in ED Medications  meclizine (ANTIVERT) tablet 25 mg (25 mg Oral Given 07/30/22 1412)    ED Course/ Medical Decision Making/ A&P                           Medical Decision Making Weylin Plagge. is a 76 y.o. male here with dizziness. Likely side effect of gabapentin. No nystagmus on exam, nl finger to nose and nl gait. I doubt herpetic encephalitis or posterior circulation stroke. Saw ophtho yesterday and had no eye involvement. Told him to dc gabapentin and take pain meds prn. Gave strict return precautions  Problems Addressed: Dizziness: acute illness or injury Herpes zoster without complication: acute illness or injury Medication side effect: acute illness or injury  Amount and/or Complexity of Data Reviewed Labs: ordered. Decision-making details documented in ED Course. Radiology: ordered and independent interpretation performed. Decision-making  details documented in ED Course.    Final Clinical Impression(s) / ED Diagnoses Final diagnoses:  None    Rx / DC Orders ED Discharge Orders     None         Drenda Freeze, MD 07/30/22 Lurline Hare

## 2022-07-30 NOTE — ED Provider Triage Note (Signed)
Emergency Medicine Provider Triage Evaluation Note  Brian Allen. , a 76 y.o. male  was evaluated in triage.  Pt complains of dizzy. Pt developed shingle rash to L side of face 6 days ago.  Seen by PCP and eye specialist yesterday and was prescribed gabapentin and valacyclovir.  Report develop dizziness with room spinning sensation after taking the medication.  Denies vision changes or hearing changes.  Report eye specialist noted no eye involvement with shingle.   Review of Systems  Positive: As above Negative: As above  Physical Exam  BP 123/81 (BP Location: Right Arm)   Pulse 77   Temp 99.7 F (37.6 C) (Oral)   Resp 16   Ht '5\' 5"'$  (1.651 m)   Wt 72.6 kg   SpO2 97%   BMI 26.63 kg/m  Gen:   Awake, no distress   Resp:  Normal effort  MSK:   Moves extremities without difficulty  Other:    Medical Decision Making  Medically screening exam initiated at 1:59 PM.  Appropriate orders placed.  Brian Allen. was informed that the remainder of the evaluation will be completed by another provider, this initial triage assessment does not replace that evaluation, and the importance of remaining in the ED until their evaluation is complete.  Rash to L side of face without eye involvement.  Has dizziness with starting the meds.  Likely need sxs control only.    Domenic Moras, PA-C 07/30/22 1402

## 2022-07-30 NOTE — ED Triage Notes (Signed)
Pt states they have only taken 2 doses of valacyclovir and gabapentin. Pt stopped taking them d/t nausea

## 2022-07-30 NOTE — Discharge Instructions (Signed)
You likely has medication side effect. Stop taking gabapentin   Take vicodin for severe pain   Continue valtrex   See your doctor for follow up   Return to ER if you have worse dizziness, headache, fever, vomiting

## 2022-08-06 ENCOUNTER — Other Ambulatory Visit: Payer: Self-pay | Admitting: Family Medicine

## 2022-08-06 DIAGNOSIS — K5900 Constipation, unspecified: Secondary | ICD-10-CM

## 2022-10-07 ENCOUNTER — Encounter (HOSPITAL_COMMUNITY): Payer: Self-pay | Admitting: Emergency Medicine

## 2022-10-07 ENCOUNTER — Ambulatory Visit (HOSPITAL_COMMUNITY)
Admission: EM | Admit: 2022-10-07 | Discharge: 2022-10-07 | Disposition: A | Discharge status: Home / Self Care | Payer: Medicare (Managed Care) | Attending: Emergency Medicine | Admitting: Emergency Medicine

## 2022-10-07 DIAGNOSIS — R220 Localized swelling, mass and lump, head: Secondary | ICD-10-CM | POA: Diagnosis not present

## 2022-10-07 MED ORDER — METHYLPREDNISOLONE SODIUM SUCC 125 MG IJ SOLR
80.0000 mg | Freq: Once | INTRAMUSCULAR | Status: AC
  Administered 2022-10-07: 80 mg via INTRAMUSCULAR

## 2022-10-07 MED ORDER — PREDNISONE 20 MG PO TABS: 40.0000 mg | ORAL_TABLET | Freq: Every day | ORAL | 0 refills | Status: AC

## 2022-10-07 MED ORDER — METHYLPREDNISOLONE SODIUM SUCC 125 MG IJ SOLR
INTRAMUSCULAR | Status: AC
  Filled 2022-10-07: qty 2

## 2022-10-07 NOTE — Discharge Instructions (Signed)
Today you are being treated for an allergic reaction to a bee sting, please consider yourself allergic to them and please avoid with your best effort moving forward  You have been given an injection of steroids today in the office to reduce inflammation that occurs with allergic reactions and stop the reaction process, ideally you will see improvement in your facial swelling and about 30 minutes  Starting tomorrow begin use of prednisone tablets every morning with food for 5 days to continue the above process  You may use warm compresses to the external area for comfort in 10 to 15-minute intervals, may also use ice if it is helpful  If the area becomes tender or painful you may use over-the-counter Tylenol or ibuprofen every 6 hours as needed  Any point if you begin to have difficulty breathing please go to the nearest emergency department for evaluation

## 2022-10-07 NOTE — ED Triage Notes (Signed)
Pt has facial swelling from bee sting. Denies tongue or throat swelling. C/o pain. Took Benadryl 25 mg at 6pm.
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
## 2022-12-12 ENCOUNTER — Other Ambulatory Visit: Payer: Self-pay | Admitting: Family Medicine

## 2022-12-12 ENCOUNTER — Ambulatory Visit
Admission: RE | Admit: 2022-12-12 | Discharge: 2022-12-12 | Disposition: A | Discharge status: Home / Self Care | Payer: Medicare (Managed Care) | Source: Ambulatory Visit | Attending: Family Medicine | Admitting: Family Medicine

## 2022-12-12 DIAGNOSIS — M545 Low back pain, unspecified: Secondary | ICD-10-CM

## 2024-03-22 ENCOUNTER — Other Ambulatory Visit: Payer: Self-pay | Admitting: Family Medicine

## 2024-03-22 DIAGNOSIS — R351 Nocturia: Secondary | ICD-10-CM

## 2024-03-28 ENCOUNTER — Other Ambulatory Visit: Payer: Medicare (Managed Care)

## 2024-04-04 ENCOUNTER — Ambulatory Visit
Admission: RE | Admit: 2024-04-04 | Discharge: 2024-04-04 | Disposition: A | Discharge status: Home / Self Care | Payer: Medicare (Managed Care) | Source: Ambulatory Visit | Attending: Family Medicine | Admitting: Family Medicine

## 2024-04-04 DIAGNOSIS — R351 Nocturia: Secondary | ICD-10-CM

## 2024-11-06 ENCOUNTER — Other Ambulatory Visit: Payer: Self-pay

## 2024-11-06 ENCOUNTER — Encounter (HOSPITAL_COMMUNITY): Payer: Self-pay | Admitting: *Deleted

## 2024-11-06 ENCOUNTER — Emergency Department (HOSPITAL_COMMUNITY)
Admission: EM | Admit: 2024-11-06 | Discharge: 2024-11-06 | Disposition: A | Discharge status: Home / Self Care | Payer: Medicare (Managed Care) | Attending: Emergency Medicine | Admitting: Emergency Medicine

## 2024-11-06 DIAGNOSIS — R519 Headache, unspecified: Secondary | ICD-10-CM | POA: Diagnosis not present

## 2024-11-06 DIAGNOSIS — Z79899 Other long term (current) drug therapy: Secondary | ICD-10-CM | POA: Insufficient documentation

## 2024-11-06 DIAGNOSIS — R112 Nausea with vomiting, unspecified: Secondary | ICD-10-CM | POA: Diagnosis present

## 2024-11-06 DIAGNOSIS — R197 Diarrhea, unspecified: Secondary | ICD-10-CM | POA: Diagnosis not present

## 2024-11-06 DIAGNOSIS — Z7982 Long term (current) use of aspirin: Secondary | ICD-10-CM | POA: Diagnosis not present

## 2024-11-06 DIAGNOSIS — R1084 Generalized abdominal pain: Secondary | ICD-10-CM | POA: Insufficient documentation

## 2024-11-06 DIAGNOSIS — R42 Dizziness and giddiness: Secondary | ICD-10-CM | POA: Insufficient documentation

## 2024-11-06 LAB — COMPREHENSIVE METABOLIC PANEL WITH GFR
ALT: 20 U/L (ref 0–44)
AST: 31 U/L (ref 15–41)
Albumin: 4.4 g/dL (ref 3.5–5.0)
Alkaline Phosphatase: 88 U/L (ref 38–126)
Anion gap: 11 (ref 5–15)
BUN: 13 mg/dL (ref 8–23)
CO2: 24 mmol/L (ref 22–32)
Calcium: 10 mg/dL (ref 8.9–10.3)
Chloride: 105 mmol/L (ref 98–111)
Creatinine, Ser: 0.9 mg/dL (ref 0.61–1.24)
GFR, Estimated: >60 mL/min (ref >60)
Glucose, Bld: 149 mg/dL — ABNORMAL HIGH (ref 70–99)
Potassium: 3.9 mmol/L (ref 3.5–5.1)
Sodium: 140 mmol/L (ref 135–145)
Total Bilirubin: 0.6 mg/dL (ref 0.0–1.2)
Total Protein: 7.5 g/dL (ref 6.5–8.1)

## 2024-11-06 LAB — LIPASE, BLOOD: Lipase: 26 U/L (ref 11–51)

## 2024-11-06 LAB — CBC
HCT: 38.9 % — ABNORMAL LOW (ref 39.0–52.0)
Hemoglobin: 13 g/dL (ref 13.0–17.0)
MCH: 31 pg (ref 26.0–34.0)
MCHC: 33.4 g/dL (ref 30.0–36.0)
MCV: 92.8 fL (ref 80.0–100.0)
Platelets: 306 K/uL (ref 150–400)
RBC: 4.19 MIL/uL — ABNORMAL LOW (ref 4.22–5.81)
RDW: 13.2 % (ref 11.5–15.5)
WBC: 7.3 K/uL (ref 4.0–10.5)
nRBC: 0 % (ref 0.0–0.2)

## 2024-11-06 MED ORDER — FAMOTIDINE IN NACL 20-0.9 MG/50ML-% IV SOLN
20.0000 mg | Freq: Once | INTRAVENOUS | Status: AC
  Administered 2024-11-06: 20 mg via INTRAVENOUS
  Filled 2024-11-06: qty 50

## 2024-11-06 MED ORDER — DICYCLOMINE HCL 10 MG PO CAPS
20.0000 mg | ORAL_CAPSULE | Freq: Once | ORAL | Status: AC
  Administered 2024-11-06: 20 mg via ORAL
  Filled 2024-11-06: qty 2

## 2024-11-06 MED ORDER — METOCLOPRAMIDE HCL 5 MG/ML IJ SOLN
10.0000 mg | Freq: Once | INTRAMUSCULAR | Status: AC
  Administered 2024-11-06: 10 mg via INTRAVENOUS
  Filled 2024-11-06: qty 2

## 2024-11-06 MED ORDER — FAMOTIDINE 20 MG PO TABS: 20.0000 mg | ORAL_TABLET | Freq: Two times a day (BID) | ORAL | 0 refills | Status: AC

## 2024-11-06 MED ORDER — DICYCLOMINE HCL 20 MG PO TABS: 20.0000 mg | ORAL_TABLET | Freq: Two times a day (BID) | ORAL | 0 refills | Status: AC

## 2024-11-06 MED ORDER — ONDANSETRON 4 MG PO TBDP: 4.0000 mg | ORAL_TABLET | Freq: Three times a day (TID) | ORAL | 0 refills | Status: AC | PRN

## 2024-11-06 MED ORDER — DIPHENHYDRAMINE HCL 25 MG PO CAPS
25.0000 mg | ORAL_CAPSULE | Freq: Once | ORAL | Status: AC
  Administered 2024-11-06: 25 mg via ORAL
  Filled 2024-11-06: qty 1

## 2024-11-06 NOTE — Discharge Instructions (Signed)
Follow up with your primary care doctor. Return for worsening symptoms.

## 2024-11-06 NOTE — ED Provider Notes (Signed)
 I provided a substantive portion of the care of this patient.  I personally made/approved the management plan for this patient and take responsibility for the patient management.  EKG Interpretation Date/Time:  Sunday November 06 2024 08:33:56 EST Ventricular Rate:  93 PR Interval:  197 QRS Duration:  96 QT Interval:  362 QTC Calculation: 451 R Axis:   -23  Text Interpretation: Sinus rhythm Consider left atrial enlargement Borderline left axis deviation Confirmed by Chelsy Parrales [phone]) on 11/06/2024 12:04:07 PM   Patient seen by me along with physician assistant.  Patient was concerned about food poisoning.  Patient had onset of nausea vomiting and diarrhea with a headache and some dizziness that started at 2 AM after eating pigs feet yesterday evening.  Denies any recent illness.  Apparently no one else partake in the pigs feet so it is hard to tell whether this could be food poisoning.  Just was notified that these were actually pickled pigs feet so that makes food poisoning given less likely.  Patient now feeling much better.  Patient's labs without significant abnormalities.  Lipase 26 complete metabolic panel normal.  White blood count 7.3 hemoglobin 13.  EKG without any acute findings.  Patient stable for discharge home.  Precautions provided that if he has any recurrent symptoms to get seen again.   Brian Fyfe, MD 11/06/24 1227

## 2024-11-06 NOTE — ED Triage Notes (Signed)
 BIB family from home for NVD, HA and dizziness. Onset 2am after eating pigs feeet yesterday evening. States, don't think the pigs feet are agreeing with me. Denies recent illness. Felt fine before eating. Rates HA 10/10. No meds PTA. Alert, NAD, calm, interactive, resps e/u, speaking in clear complete sentences, skin W&D.

## 2024-11-16 NOTE — ED Provider Notes (Signed)
 [place] EMERGENCY DEPARTMENT AT [facility] Provider Note   CSN: [id number] Arrival date & time: 11/06/24  9173     Patient presents with: Emesis   Brian Allen. is a 78 y.o. male.    Emesis  Patient is a 78 year old male present emergency room today with complaints of nausea vomiting diarrhea states he has some headache and dizziness as well his symptoms started at 2 AM after eating pig's feet.  He denies any chest pain or difficulty breathing. No fevers. Diarrhea has no blood in it and emesis is nml color (yellow) without blood.        Prior to Admission medications   Medication Sig Start Date End Date Taking? Authorizing Provider  dicyclomine  (BENTYL ) 20 MG tablet Take 1 tablet (20 mg total) by mouth 2 (two) times daily. 11/06/24  Yes Baylen Dea S, PA  famotidine  (PEPCID ) 20 MG tablet Take 1 tablet (20 mg total) by mouth 2 (two) times daily. 11/06/24  Yes Micaella Gitto S, PA  ondansetron  (ZOFRAN -ODT) 4 MG disintegrating tablet Take 1 tablet (4 mg total) by mouth every 8 (eight) hours as needed for nausea or vomiting. 11/06/24  Yes Dandra Shambaugh S, PA  acetaminophen  (TYLENOL ) 500 MG tablet Take 1 tablet (500 mg total) by mouth every 6 (six) hours as needed. Patient taking differently: Take 500 mg by mouth every 6 (six) hours as needed for moderate pain. 01/09/20   LampteyAleene KIDD, MD  albuterol  (VENTOLIN  HFA) 108 (90 Base) MCG/ACT inhaler Inhale 1-2 puffs into the lungs every 6 (six) hours as needed for wheezing or shortness of breath. 04/03/22   Roemhildt, Lorin T, PA-C  amLODipine  (NORVASC ) 10 MG tablet Take 1 tablet (10 mg total) by mouth daily. 04/30/21   Norleen Lynwood ORN, MD  aspirin  81 MG tablet Take 81 mg by mouth daily.    [provider]  atorvastatin  (LIPITOR ) 80 MG tablet SMARTSIG:1 Tablet(s) By Mouth Every Evening 04/30/21   Norleen Lynwood ORN, MD  carvedilol  (COREG ) 3.125 MG tablet Take 1 tablet (3.125 mg total) by mouth 2 (two) times daily with a  meal. 04/30/21   Norleen Lynwood ORN, MD  diclofenac  Sodium (VOLTAREN ) 1 % GEL Apply 2 g topically 3 (three) times daily as needed. 05/16/22   Sponseller, Rebekah R, PA-C  dorzolamide-timolol (COSOPT) 22.3-6.8 MG/ML ophthalmic solution Place 1 drop into both eyes 2 (two) times daily. 03/27/20   [provider]  fesoterodine  (TOVIAZ ) 4 MG TB24 tablet Take 1 tablet (4 mg total) by mouth daily. 10/12/20   Norleen Lynwood ORN, MD  gabapentin  (NEURONTIN ) 100 MG capsule Take 1 capsule (100 mg total) by mouth 3 (three) times daily. 07/12/20   Norleen Lynwood ORN, MD  HYDROcodone -acetaminophen  (NORCO/VICODIN) 5-325 MG tablet Take 1 tablet by mouth every 6 (six) hours as needed. 07/30/22   Patt Alm Macho, MD  latanoprost (XALATAN) 0.005 % ophthalmic solution Place 1 drop into both eyes at bedtime.    [provider]  linaclotide  (LINZESS ) 290 MCG CAPS capsule Take 1 capsule (290 mcg total) by mouth daily before breakfast. 04/13/20   Teressa Toribio SQUIBB, MD  lisinopril  (ZESTRIL ) 40 MG tablet Take 1 tablet (40 mg total) by mouth daily. 04/30/21   Norleen Lynwood ORN, MD  omeprazole  (PRILOSEC) 40 MG capsule Take 1 capsule (40 mg total) by mouth daily before breakfast. 06/27/21   Teressa Toribio SQUIBB, MD  predniSONE  (DELTASONE ) 20 MG tablet Take 2 tablets (40 mg total) by mouth daily. 10/07/22  White, Adrienne R, NP  promethazine -dextromethorphan (PROMETHAZINE -DM) 6.25-15 MG/5ML syrup Take 2.5 mLs by mouth 4 (four) times daily as needed for cough. 04/03/22   Roemhildt, Lorin T, PA-C  sildenafil  (VIAGRA ) 100 MG tablet Take 0.5-1 tablets (50-100 mg total) by mouth daily as needed for erectile dysfunction. 09/22/18   Norleen Lynwood ORN, MD  sucralfate  (CARAFATE ) 1 GM/10ML suspension Take 10 mLs (1 g total) by mouth at bedtime. 02/23/20   Armbruster, Elspeth SQUIBB, MD    Allergies: Sildenafil  citrate    Review of Systems  Gastrointestinal:  Positive for vomiting.    Updated Vital Signs BP (!) 146/91   Pulse 89   Temp 99.4 F (37.4 C)    Resp 19   Wt 74.8 kg   SpO2 95%   BMI 27.46 kg/m   Physical Exam Vitals and nursing note reviewed.  Constitutional:      General: He is not in acute distress. HENT:     Head: Normocephalic and atraumatic.     Nose: Nose normal.     Mouth/Throat:     Mouth: Mucous membranes are dry.  Eyes:     General: No scleral icterus. Cardiovascular:     Rate and Rhythm: Normal rate and regular rhythm.     Pulses: Normal pulses.     Heart sounds: Normal heart sounds.  Pulmonary:     Effort: Pulmonary effort is normal. No respiratory distress.     Breath sounds: No wheezing.  Abdominal:     Palpations: Abdomen is soft.     Tenderness: There is no abdominal tenderness. There is no guarding or rebound.  Musculoskeletal:     Cervical back: Normal range of motion.     Right lower leg: No edema.     Left lower leg: No edema.  Skin:    General: Skin is warm and dry.     Capillary Refill: Capillary refill takes less than 2 seconds.  Neurological:     Mental Status: He is alert. Mental status is at baseline.  Psychiatric:        Mood and Affect: Mood normal.        Behavior: Behavior normal.     (all labs ordered are listed, but only abnormal results are displayed) Labs Reviewed  COMPREHENSIVE METABOLIC PANEL WITH GFR - Abnormal; Notable for the following components:      Result Value   Glucose, Bld 149 (*)    All other components within normal limits  CBC - Abnormal; Notable for the following components:   RBC 4.19 (*)    HCT 38.9 (*)    All other components within normal limits  LIPASE, BLOOD    EKG: EKG Interpretation Date/Time:  Sunday November 06 2024 08:33:56 EST Ventricular Rate:  93 PR Interval:  197 QRS Duration:  96 QT Interval:  362 QTC Calculation: 451 R Axis:   -23  Text Interpretation: Sinus rhythm Consider left atrial enlargement Borderline left axis deviation Confirmed by Geraldene Hamilton [phone]) on 11/06/2024 12:04:07 PM  Radiology: No results  found.   Procedures   Medications Ordered in the ED  dicyclomine  (BENTYL ) capsule 20 mg (20 mg Oral Given 11/06/24 0946)  famotidine  (PEPCID ) IVPB 20 mg premix (0 mg Intravenous Stopped 11/06/24 1022)  metoCLOPramide  (REGLAN ) injection 10 mg (10 mg Intravenous Given 11/06/24 0951)  diphenhydrAMINE  (BENADRYL ) capsule 25 mg (25 mg Oral Given 11/06/24 0946)  Medical Decision Making Amount and/or Complexity of Data Reviewed Labs: ordered.  Risk Prescription drug management.   This patient presents to the ED for concern of vomiting, this involves a number of treatment options, and is a complaint that carries with it a moderate risk of complications and morbidity. A differential diagnosis was considered for the patient's symptoms which is discussed below:   The causes of generalized abdominal pain include but are not limited to AAA, mesenteric ischemia, appendicitis, diverticulitis, DKA, gastritis, gastroenteritis, AMI, nephrolithiasis, pancreatitis, peritonitis, adrenal insufficiency,lead poisoning, iron toxicity, intestinal ischemia, constipation, UTI,SBO/LBO, splenic rupture, biliary disease, IBD, IBS, PUD, or hepatitis.   Co morbidities: Discussed in HPI   Brief History:  Patient is a 78 year old male present emergency room today with complaints of nausea vomiting diarrhea states he has some headache and dizziness as well his symptoms started at 2 AM after eating pig's feet.  He denies any chest pain or difficulty breathing. No fevers. Diarrhea has no blood in it and emesis is nml color (yellow) without blood.      EMR reviewed including pt PMHx, past surgical history and past visits to ER.   See HPI for more details   Lab Tests:   I personally reviewed all laboratory work and imaging. Metabolic panel without any acute abnormality specifically kidney function within normal limits and no significant electrolyte abnormalities. CBC without  leukocytosis or significant anemia.   Imaging Studies:  No imaging studies ordered for this patient    Cardiac Monitoring:  The patient was maintained on a cardiac monitor.  I personally viewed and interpreted the cardiac monitored which showed an underlying rhythm of: NSR EKG non-ischemic   Medicines ordered:  I ordered medication including Pepcid , Reglan , Benadryl , Bentyl  for nausea vomiting diarrhea headache Reevaluation of the patient after these medicines showed that the patient resolved I have reviewed the patients home medicines and have made adjustments as needed   Critical Interventions:     Consults/Attending Physician I discussed this case with my attending physician who cosigned this note including patient's presenting symptoms, physical exam, and planned diagnostics and interventions. Attending physician stated agreement with plan or made changes to plan which were implemented.   Attending physician assessed patient at bedside.    Reevaluation:  After the interventions noted above I re-evaluated patient and found that they have :resolved   Social Determinants of Health:      Problem List / ED Course:  Patient had nausea vomiting diarrhea some crampy abdominal pain all of the symptoms are completely resolved including the headache with medications here.  He is feeling much improved tolerating p.o. and ambulating.  I feel comfortable discharging this man home I recommend following up with primary care.  Return to emergency room for any new or concerning symptoms.   Dispostion:  After consideration of the diagnostic results and the patients response to treatment, I feel that the patent would benefit from outpatient follow-up.    Final diagnoses:  Nausea and vomiting, unspecified vomiting type    ED Discharge Orders          Ordered    ondansetron  (ZOFRAN -ODT) 4 MG disintegrating tablet  Every 8 hours PRN        11/06/24 1233    dicyclomine   (BENTYL ) 20 MG tablet  2 times daily        11/06/24 1233    famotidine  (PEPCID ) 20 MG tablet  2 times daily        11/06/24 1233  Neldon Hamp RAMAN, [place] 11/16/24 1310    Geraldene Hamilton, MD 11/17/24 1032

## 2024-12-13 ENCOUNTER — Other Ambulatory Visit: Payer: Self-pay | Admitting: Family Medicine

## 2024-12-13 ENCOUNTER — Ambulatory Visit
Admission: RE | Admit: 2024-12-13 | Discharge: 2024-12-13 | Disposition: A | Payer: Medicare (Managed Care) | Source: Ambulatory Visit | Attending: Family Medicine | Admitting: Family Medicine

## 2024-12-13 DIAGNOSIS — R7981 Abnormal blood-gas level: Secondary | ICD-10-CM
